# Patient Record
Sex: Female | Born: 1965 | Race: White | Hispanic: Yes | Marital: Married | Smoking: Never smoker
Health system: Southern US, Community
[De-identification: ages and names within clinical notes are randomized; demographics above are authoritative.]

## PROBLEM LIST (undated history)

## (undated) DIAGNOSIS — B019 Varicella without complication: Secondary | ICD-10-CM

## (undated) DIAGNOSIS — I10 Essential (primary) hypertension: Secondary | ICD-10-CM

## (undated) HISTORY — DX: Essential (primary) hypertension: I10

## (undated) HISTORY — PX: TUBAL LIGATION: SHX77

## (undated) HISTORY — PX: DILATION AND CURETTAGE OF UTERUS: SHX78

---

## 2000-10-25 ENCOUNTER — Other Ambulatory Visit: Admission: RE | Admit: 2000-10-25 | Discharge: 2000-10-25 | Payer: Self-pay | Admitting: Obstetrics and Gynecology

## 2002-05-17 ENCOUNTER — Other Ambulatory Visit: Admission: RE | Admit: 2002-05-17 | Discharge: 2002-05-17 | Payer: Self-pay | Admitting: Obstetrics and Gynecology

## 2006-10-31 ENCOUNTER — Emergency Department (HOSPITAL_COMMUNITY): Admission: EM | Admit: 2006-10-31 | Discharge: 2006-10-31 | Payer: Self-pay | Admitting: Emergency Medicine

## 2020-11-16 DIAGNOSIS — K56609 Unspecified intestinal obstruction, unspecified as to partial versus complete obstruction: Secondary | ICD-10-CM

## 2020-11-16 HISTORY — DX: Unspecified intestinal obstruction, unspecified as to partial versus complete obstruction: K56.609

## 2020-11-20 ENCOUNTER — Other Ambulatory Visit: Payer: Self-pay

## 2020-11-20 ENCOUNTER — Encounter (HOSPITAL_BASED_OUTPATIENT_CLINIC_OR_DEPARTMENT_OTHER): Payer: Self-pay | Admitting: *Deleted

## 2020-11-20 DIAGNOSIS — K5652 Intestinal adhesions [bands] with complete obstruction: Principal | ICD-10-CM | POA: Diagnosis present

## 2020-11-20 DIAGNOSIS — Z98891 History of uterine scar from previous surgery: Secondary | ICD-10-CM

## 2020-11-20 DIAGNOSIS — U071 COVID-19: Secondary | ICD-10-CM | POA: Diagnosis present

## 2020-11-20 DIAGNOSIS — K56609 Unspecified intestinal obstruction, unspecified as to partial versus complete obstruction: Secondary | ICD-10-CM | POA: Diagnosis not present

## 2020-11-20 LAB — URINALYSIS, ROUTINE W REFLEX MICROSCOPIC
Bilirubin Urine: NEGATIVE
Glucose, UA: NEGATIVE mg/dL
Hgb urine dipstick: NEGATIVE
Ketones, ur: NEGATIVE mg/dL
Leukocytes,Ua: NEGATIVE
Nitrite: NEGATIVE
Protein, ur: NEGATIVE mg/dL
Specific Gravity, Urine: 1.015 (ref 1.005–1.030)
pH: 7 (ref 5.0–8.0)

## 2020-11-20 LAB — CBC
HCT: 39 % (ref 36.0–46.0)
Hemoglobin: 13.3 g/dL (ref 12.0–15.0)
MCH: 31.9 pg (ref 26.0–34.0)
MCHC: 34.1 g/dL (ref 30.0–36.0)
MCV: 93.5 fL (ref 80.0–100.0)
Platelets: 226 10*3/uL (ref 150–400)
RBC: 4.17 MIL/uL (ref 3.87–5.11)
RDW: 13.2 % (ref 11.5–15.5)
WBC: 6.5 10*3/uL (ref 4.0–10.5)
nRBC: 0 % (ref 0.0–0.2)

## 2020-11-20 LAB — PREGNANCY, URINE: Preg Test, Ur: NEGATIVE

## 2020-11-20 LAB — COMPREHENSIVE METABOLIC PANEL
ALT: 30 U/L (ref 0–44)
AST: 30 U/L (ref 15–41)
Albumin: 4.6 g/dL (ref 3.5–5.0)
Alkaline Phosphatase: 61 U/L (ref 38–126)
Anion gap: 10 (ref 5–15)
BUN: 18 mg/dL (ref 6–20)
CO2: 24 mmol/L (ref 22–32)
Calcium: 9.4 mg/dL (ref 8.9–10.3)
Chloride: 103 mmol/L (ref 98–111)
Creatinine, Ser: 0.72 mg/dL (ref 0.44–1.00)
GFR, Estimated: >60 mL/min (ref >60)
Glucose, Bld: 112 mg/dL — ABNORMAL HIGH (ref 70–99)
Potassium: 4 mmol/L (ref 3.5–5.1)
Sodium: 137 mmol/L (ref 135–145)
Total Bilirubin: 0.7 mg/dL (ref 0.3–1.2)
Total Protein: 7.4 g/dL (ref 6.5–8.1)

## 2020-11-20 LAB — LIPASE, BLOOD: Lipase: 30 U/L (ref 11–51)

## 2020-11-20 NOTE — ED Triage Notes (Signed)
Mid abdominal pain radiating into back x 4 hours. Vomiting x 1, denies diarrhea. Pt in obvious pain in triage.

## 2020-11-21 ENCOUNTER — Emergency Department (HOSPITAL_BASED_OUTPATIENT_CLINIC_OR_DEPARTMENT_OTHER): Payer: 59

## 2020-11-21 ENCOUNTER — Inpatient Hospital Stay (HOSPITAL_COMMUNITY): Payer: 59

## 2020-11-21 ENCOUNTER — Inpatient Hospital Stay (HOSPITAL_BASED_OUTPATIENT_CLINIC_OR_DEPARTMENT_OTHER)
Admission: EM | Admit: 2020-11-21 | Discharge: 2020-11-24 | DRG: 388 | Disposition: A | Discharge status: Home / Self Care | Payer: 59 | Attending: Surgery | Admitting: Surgery

## 2020-11-21 ENCOUNTER — Encounter (HOSPITAL_COMMUNITY): Payer: Self-pay | Admitting: Surgery

## 2020-11-21 DIAGNOSIS — R109 Unspecified abdominal pain: Secondary | ICD-10-CM

## 2020-11-21 DIAGNOSIS — K5652 Intestinal adhesions [bands] with complete obstruction: Secondary | ICD-10-CM | POA: Diagnosis present

## 2020-11-21 DIAGNOSIS — Z0189 Encounter for other specified special examinations: Secondary | ICD-10-CM

## 2020-11-21 DIAGNOSIS — K56609 Unspecified intestinal obstruction, unspecified as to partial versus complete obstruction: Secondary | ICD-10-CM | POA: Diagnosis present

## 2020-11-21 DIAGNOSIS — U071 COVID-19: Secondary | ICD-10-CM

## 2020-11-21 DIAGNOSIS — Z98891 History of uterine scar from previous surgery: Secondary | ICD-10-CM | POA: Diagnosis not present

## 2020-11-21 LAB — PHOSPHORUS: Phosphorus: 3.6 mg/dL (ref 2.5–4.6)

## 2020-11-21 LAB — HEMOGLOBIN A1C
Hgb A1c MFr Bld: 5.1 % (ref 4.8–5.6)
Mean Plasma Glucose: 99.67 mg/dL

## 2020-11-21 LAB — SARS CORONAVIRUS 2 (TAT 6-24 HRS): SARS Coronavirus 2: POSITIVE — AB

## 2020-11-21 LAB — HIV ANTIBODY (ROUTINE TESTING W REFLEX): HIV Screen 4th Generation wRfx: NONREACTIVE

## 2020-11-21 LAB — PREALBUMIN: Prealbumin: 27.5 mg/dL (ref 18–38)

## 2020-11-21 LAB — MAGNESIUM: Magnesium: 1.9 mg/dL (ref 1.7–2.4)

## 2020-11-21 MED ORDER — LACTATED RINGERS IV BOLUS
1000.0000 mL | Freq: Once | INTRAVENOUS | Status: AC
  Administered 2020-11-21: 1000 mL via INTRAVENOUS

## 2020-11-21 MED ORDER — BISACODYL 10 MG RE SUPP: 10.0000 mg | Freq: Two times a day (BID) | RECTAL | Status: DC | PRN

## 2020-11-21 MED ORDER — PHENOL 1.4 % MT LIQD
1.0000 | OROMUCOSAL | Status: DC | PRN
  Filled 2020-11-21: qty 177

## 2020-11-21 MED ORDER — ENALAPRILAT 1.25 MG/ML IV SOLN
0.6250 mg | Freq: Four times a day (QID) | INTRAVENOUS | Status: DC | PRN
  Filled 2020-11-21: qty 1

## 2020-11-21 MED ORDER — ENOXAPARIN SODIUM 40 MG/0.4ML ~~LOC~~ SOLN
40.0000 mg | SUBCUTANEOUS | Status: DC
  Administered 2020-11-21 – 2020-11-24 (×4): 40 mg via SUBCUTANEOUS
  Filled 2020-11-21 (×4): qty 0.4

## 2020-11-21 MED ORDER — PHENOL 1.4 % MT LIQD
2.0000 | OROMUCOSAL | Status: DC | PRN
  Filled 2020-11-21: qty 177

## 2020-11-21 MED ORDER — KETOROLAC TROMETHAMINE 15 MG/ML IJ SOLN
15.0000 mg | Freq: Four times a day (QID) | INTRAMUSCULAR | Status: DC | PRN
  Administered 2020-11-22: 07:00:00 15 mg via INTRAVENOUS
  Filled 2020-11-21: qty 1

## 2020-11-21 MED ORDER — SODIUM CHLORIDE 0.9 % IV SOLN: Freq: Three times a day (TID) | INTRAVENOUS | Status: DC | PRN

## 2020-11-21 MED ORDER — LACTATED RINGERS IV BOLUS: 1000.0000 mL | Freq: Three times a day (TID) | INTRAVENOUS | Status: DC | PRN

## 2020-11-21 MED ORDER — SODIUM CHLORIDE 0.9 % IV SOLN
8.0000 mg | Freq: Four times a day (QID) | INTRAVENOUS | Status: DC | PRN
  Filled 2020-11-21: qty 4

## 2020-11-21 MED ORDER — ONDANSETRON HCL 4 MG/2ML IJ SOLN
4.0000 mg | Freq: Four times a day (QID) | INTRAMUSCULAR | Status: DC | PRN
  Administered 2020-11-21: 4 mg via INTRAVENOUS
  Filled 2020-11-21: qty 2

## 2020-11-21 MED ORDER — HYDROCORTISONE (PERIANAL) 2.5 % EX CREA
1.0000 "application " | TOPICAL_CREAM | Freq: Four times a day (QID) | CUTANEOUS | Status: DC | PRN
  Filled 2020-11-21: qty 28.35

## 2020-11-21 MED ORDER — HYDROCORTISONE 1 % EX CREA
1.0000 "application " | TOPICAL_CREAM | Freq: Three times a day (TID) | CUTANEOUS | Status: DC | PRN
  Filled 2020-11-21: qty 28

## 2020-11-21 MED ORDER — DIPHENHYDRAMINE HCL 50 MG/ML IJ SOLN: 12.5000 mg | Freq: Four times a day (QID) | INTRAMUSCULAR | Status: DC | PRN

## 2020-11-21 MED ORDER — DIATRIZOATE MEGLUMINE & SODIUM 66-10 % PO SOLN
90.0000 mL | Freq: Once | ORAL | Status: DC
  Filled 2020-11-21: qty 90

## 2020-11-21 MED ORDER — HYDROMORPHONE HCL 1 MG/ML IJ SOLN
1.0000 mg | Freq: Once | INTRAMUSCULAR | Status: AC
Start: 2020-11-21 — End: 2020-11-21
  Administered 2020-11-21: 1 mg via INTRAVENOUS
  Filled 2020-11-21: qty 1

## 2020-11-21 MED ORDER — IOHEXOL 300 MG/ML  SOLN
100.0000 mL | Freq: Once | INTRAMUSCULAR | Status: AC | PRN
  Administered 2020-11-21: 80 mL via INTRAVENOUS

## 2020-11-21 MED ORDER — METOPROLOL TARTRATE 5 MG/5ML IV SOLN: 5.0000 mg | Freq: Four times a day (QID) | INTRAVENOUS | Status: DC | PRN

## 2020-11-21 MED ORDER — BISACODYL 10 MG RE SUPP
10.0000 mg | Freq: Every day | RECTAL | Status: DC
  Administered 2020-11-21 – 2020-11-22 (×2): 10 mg via RECTAL
  Filled 2020-11-21 (×2): qty 1

## 2020-11-21 MED ORDER — MENTHOL 3 MG MT LOZG
1.0000 | LOZENGE | OROMUCOSAL | Status: DC | PRN
  Filled 2020-11-21: qty 9

## 2020-11-21 MED ORDER — PROCHLORPERAZINE EDISYLATE 10 MG/2ML IJ SOLN
5.0000 mg | INTRAMUSCULAR | Status: DC | PRN
  Filled 2020-11-21: qty 2

## 2020-11-21 MED ORDER — ALUM & MAG HYDROXIDE-SIMETH 200-200-20 MG/5ML PO SUSP: 30.0000 mL | Freq: Four times a day (QID) | ORAL | Status: DC | PRN

## 2020-11-21 MED ORDER — LACTATED RINGERS IV SOLN: INTRAVENOUS | Status: DC

## 2020-11-21 MED ORDER — ONDANSETRON HCL 4 MG/2ML IJ SOLN
4.0000 mg | Freq: Once | INTRAMUSCULAR | Status: AC
  Administered 2020-11-21: 4 mg via INTRAVENOUS
  Filled 2020-11-21: qty 2

## 2020-11-21 MED ORDER — SODIUM CHLORIDE 0.9 % IV BOLUS
1000.0000 mL | Freq: Once | INTRAVENOUS | Status: AC
  Administered 2020-11-21: 1000 mL via INTRAVENOUS

## 2020-11-21 MED ORDER — DIATRIZOATE MEGLUMINE & SODIUM 66-10 % PO SOLN
90.0000 mL | Freq: Once | ORAL | Status: AC
  Administered 2020-11-21: 90 mL via NASOGASTRIC
  Filled 2020-11-21 (×2): qty 90

## 2020-11-21 MED ORDER — METHOCARBAMOL 1000 MG/10ML IJ SOLN
1000.0000 mg | Freq: Four times a day (QID) | INTRAVENOUS | Status: DC | PRN
  Filled 2020-11-21: qty 10

## 2020-11-21 MED ORDER — MAGIC MOUTHWASH
15.0000 mL | Freq: Four times a day (QID) | ORAL | Status: DC | PRN
  Filled 2020-11-21: qty 15

## 2020-11-21 MED ORDER — HYDROMORPHONE HCL 1 MG/ML IJ SOLN
0.5000 mg | INTRAMUSCULAR | Status: DC | PRN
  Administered 2020-11-21: 2 mg via INTRAVENOUS
  Administered 2020-11-21 (×2): 1 mg via INTRAVENOUS
  Filled 2020-11-21 (×2): qty 2
  Filled 2020-11-21: qty 1

## 2020-11-21 MED ORDER — LIP MEDEX EX OINT
1.0000 "application " | TOPICAL_OINTMENT | Freq: Two times a day (BID) | CUTANEOUS | Status: DC
  Administered 2020-11-21 – 2020-11-22 (×2): 1 via TOPICAL
  Filled 2020-11-21 (×2): qty 7

## 2020-11-21 MED ORDER — SIMETHICONE 40 MG/0.6ML PO SUSP
40.0000 mg | Freq: Four times a day (QID) | ORAL | Status: DC | PRN
  Filled 2020-11-21: qty 0.6

## 2020-11-21 MED ORDER — ACETAMINOPHEN 650 MG RE SUPP: 650.0000 mg | Freq: Four times a day (QID) | RECTAL | Status: DC | PRN

## 2020-11-21 MED ORDER — GUAIFENESIN-DM 100-10 MG/5ML PO SYRP: 10.0000 mL | ORAL_SOLUTION | ORAL | Status: DC | PRN

## 2020-11-21 NOTE — H&P (Signed)
Cheryl Hanna  1966-11-03 [id number]  CARE TEAM:  PCP: Patient, No Pcp Per  Outpatient Care Team: Patient Care Team: Patient, No Pcp Per as PCP - General (General Practice)  Inpatient Treatment Team: Treatment Team: Attending Provider: Fatima Blank, MD; Consulting Physician: Edison Pace Md, MD; Registered Nurse: Dolphus Jenny, RN   This patient is a 55 y.o.female who presents today for surgical evaluation at the request of Dr Dayna Barker.   Chief complaint / Reason for evaluation: SBO  Active woman who is had occasional intermittent cramping for many years but nothing severe.  However had more severe attack with nausea vomiting and retching.  Became unbearable.  Came to [facility] emergency room.  Exam and CT scan findings concerning for bowel obstruction.  Surgical consultation requested.  Patient transferred from [facility] emergency room to [facility] for this.  NG tube placed.  Some relief but still having some crampiness.  No history of GI bleeding.  No bloody hematemesis or coffee-ground emesis she is originally from [place].  She had a C-section in 1993 but no other surgeries.  Does not smoke.  No diabetes.  No cardiac or pulmonary issues.  Normally can walk several miles without difficulty.  Usually moves her bowels once or twice a day.  No personal nor family history of GI/colon cancer, inflammatory bowel disease, irritable bowel syndrome, allergy such as Celiac Sprue, dietary/dairy problems, colitis, ulcers nor gastritis.  No recent sick contacts/gastroenteritis.  No travel outside the country.  No changes in diet.  No dysphagia to solids or liquids.  No significant heartburn or reflux.  No hematochezia, hematemesis, coffee ground emesis.  No evidence of prior gastric/peptic ulceration.    Assessment  Cheryl Hanna  55 y.o. female       Problem List:  Principal Problem:   SBO (small bowel obstruction) (HCC) Active Problems:   Abdominal cramping   Small bowel  obstruction and distal ileum transition point.  Most likely due to adhesions from C-section  Plan:  IV fluids.  Nausea and pain control.  Nasogastric tube decompression.   Hospitalization.  Small bowel protocol.  Pathophysiology of small bowel obstruction with differential diagnosis explained.  Plan of rehydration, nausea and pain control and nasogastric tube decompression with serial exams and x-rays explained.  Patient expressed understanding.  She is uncomfortable but she does not have frank peritonitis.  There is no evidence of perforation.  She does not have free air.  Therefore try and control this nonoperatively with close follow-up.  The fact that it is a high-grade obstruction gives me pause that she may not fly and will require surgery in the next couple days, but we will see.  -VTE prophylaxis- SCDs, etc  -mobilize as tolerated to help recovery  30 minutes spent in review, evaluation, examination, counseling, and coordination of care.  More than 50% of that time was spent in counseling.  Adin Hector, MD, FACS, MASCRS Gastrointestinal and Minimally Invasive Surgery  [facility] [address] [place], [place] [address] [phone] Fax [phone] Main/Paging  CONTACT INFORMATION: Weekday (9AM-5PM) concerns: Call [facility] main office at [phone] Weeknight (5PM-9AM) or Weekend/Holiday concerns: Check [email or web address] for General Surgery [facility] coverage (Please, do not use SecureChat as it is not reliable communication to operating surgeons for immediate patient care)      11/21/2020      History reviewed. No pertinent past medical history.  Past Surgical History:  Procedure Laterality Date  .  CESAREAN SECTION      Social History   Socioeconomic History  . Marital status: Married    Spouse name: Not on file  . Number of children: Not on file  . Years of education: Not on file  . Highest education level: Not on file   Occupational History  . Not on file  Tobacco Use  . Smoking status: Never Smoker  . Smokeless tobacco: Never Used  Substance and Sexual Activity  . Alcohol use: Not Currently  . Drug use: Not Currently  . Sexual activity: Not on file  Other Topics Concern  . Not on file  Social History Narrative  . Not on file   Social Determinants of Health   Financial Resource Strain: Not on file  Food Insecurity: Not on file  Transportation Needs: Not on file  Physical Activity: Not on file  Stress: Not on file  Social Connections: Not on file  Intimate Partner Violence: Not on file    History reviewed. No pertinent family history.  Current Facility-Administered Medications  Medication Dose Route Frequency Provider Last Rate Last Admin  . acetaminophen (TYLENOL) suppository 650 mg  650 mg Rectal Q6H PRN Karie Soda, MD      . alum & mag hydroxide-simeth (MAALOX/MYLANTA) 200-200-20 MG/5ML suspension 30 mL  30 mL Oral Q6H PRN Karie Soda, MD      . bisacodyl (DULCOLAX) suppository 10 mg  10 mg Rectal Daily Karie Soda, MD      . diatrizoate meglumine-sodium (GASTROGRAFIN) 66-10 % solution 90 mL  90 mL Per NG tube Once Karie Soda, MD      . diphenhydrAMINE (BENADRYL) injection 12.5-25 mg  12.5-25 mg Intravenous Q6H PRN Karie Soda, MD      . HYDROmorphone (DILAUDID) injection 0.5-2 mg  0.5-2 mg Intravenous Q2H PRN Karie Soda, MD   2 mg at 11/21/20 0352  . lactated ringers bolus 1,000 mL  1,000 mL Intravenous Once Karie Soda, MD      . lactated ringers bolus 1,000 mL  1,000 mL Intravenous Q8H PRN Tayten Bergdoll, Viviann Spare, MD      . lip balm (CARMEX) ointment 1 application  1 application Topical BID Karie Soda, MD      . magic mouthwash  15 mL Oral QID PRN Karie Soda, MD      . menthol-cetylpyridinium (CEPACOL) lozenge 3 mg  1 lozenge Oral PRN Karie Soda, MD      . methocarbamol (ROBAXIN) 1,000 mg in dextrose 5 % 100 mL IVPB  1,000 mg Intravenous Q6H PRN Karie Soda, MD       . ondansetron [facility]) injection 4 mg  4 mg Intravenous Q6H PRN Karie Soda, MD   4 mg at 11/21/20 0351   Or  . ondansetron (ZOFRAN) 8 mg in sodium chloride 0.9 % 50 mL IVPB  8 mg Intravenous Q6H PRN Karie Soda, MD      . phenol (CHLORASEPTIC) mouth spray 2 spray  2 spray Mouth/Throat PRN Karie Soda, MD      . prochlorperazine (COMPAZINE) injection 5-10 mg  5-10 mg Intravenous Q4H PRN Karie Soda, MD       No current outpatient medications on file.     No Known Allergies  ROS:   All other systems reviewed & are negative except per HPI or as noted below: Constitutional:  No fevers, chills, sweats.  Weight stable Eyes:  No vision changes, No discharge HENT:  No sore throats, nasal drainage Lymph: No neck swelling, No bruising easily Pulmonary:  No cough, productive sputum CV: No orthopnea, PND  Patient walks 60 minutes for about 2 miles without difficulty.  No exertional chest/neck/shoulder/arm pain. GI:  No personal nor family history of GI/colon cancer, inflammatory bowel disease, irritable bowel syndrome, allergy such as Celiac Sprue, dietary/dairy problems, colitis, ulcers nor gastritis.  No recent sick contacts/gastroenteritis.  No travel outside the country.  No changes in diet. Renal: No UTIs, No hematuria Genital:  No drainage, bleeding, masses Musculoskeletal: No severe joint pain.  Good ROM major joints Skin:  No sores or lesions.  No rashes Heme/Lymph:  No easy bleeding.  No swollen lymph nodes Neuro: No focal weakness/numbness.  No seizures Psych: No suicidal ideation.  No hallucinations  BP 139/88   Pulse 71   Temp 98 F (36.7 C)   Resp 16   LMP 09/20/2020   SpO2 100%   Physical Exam: Constitutional: Not cachectic.  Hygeine adequate.  Vitals signs as above.   Eyes: Pupils reactive, normal extraocular movements. Sclera nonicteric Neuro: CN II-XII intact.  No major focal sensory defects.  No major motor deficits. Lymph: No head/neck/groin  lymphadenopathy Psych:  No severe agitation.  No severe anxiety.  Judgment & insight Adequate, Oriented x4, HENT: Normocephalic, Mucus membranes moist.  No thrush.   Neck: Supple, No tracheal deviation.  No obvious thyromegaly Chest: No pain to chest wall compression.  Good respiratory excursion.  No audible wheezing CV:  Pulses intact.  Regular rhythm.  No major extremity edema Abdomen:  Somewhat firm.  Moderately distended.  Mild diffuse abdominal discomfort but no frank point of guardedness. No incarcerated hernias.  No hepatomegaly.  No splenomegaly Gen:  No inguinal hernias.  No inguinal lymphadenopathy.   Ext: No obvious deformity or contracture no significant edema.  No cyanosis Skin: No major subcutaneous nodules.  Warm and dry Musculoskeletal: Severe joint rigidity not present.  No obvious clubbing.  No digital petechiae.     Results:   Labs: Results for orders placed or performed during the hospital encounter of 11/21/20 (from the past 48 hour(s))  Lipase, blood     Status: None   Collection Time: 11/20/20 11:23 PM  Result Value Ref Range   Lipase 30 11 - 51 U/L    Comment: Performed at [facility], [address] [place], [place] [address]  Comprehensive metabolic panel     Status: Abnormal   Collection Time: 11/20/20 11:23 PM  Result Value Ref Range   Sodium 137 135 - 145 mmol/L   Potassium 4.0 3.5 - 5.1 mmol/L   Chloride 103 98 - 111 mmol/L   CO2 24 22 - 32 mmol/L   Glucose, Bld 112 (H) 70 - 99 mg/dL    Comment: Glucose reference range applies only to samples taken after fasting for at least 8 hours.   BUN 18 6 - 20 mg/dL   Creatinine, Ser 2.63 0.44 - 1.00 mg/dL   Calcium 9.4 8.9 - 33.5 mg/dL   Total Protein 7.4 6.5 - 8.1 g/dL   Albumin 4.6 3.5 - 5.0 g/dL   AST 30 15 - 41 U/L   ALT 30 0 - 44 U/L   Alkaline Phosphatase 61 38 - 126 U/L   Total Bilirubin 0.7 0.3 - 1.2 mg/dL   GFR, Estimated >45 >62 mL/min    Comment: (NOTE) Calculated using the CKD-EPI  Creatinine Equation (2021)    Anion gap 10 5 - 15    Comment: Performed at [facility], [address] [place], [place] [address]  CBC     Status: None   Collection Time: 11/20/20 11:23 PM  Result Value Ref Range   WBC 6.5 4.0 - 10.5 K/uL   RBC 4.17 3.87 - 5.11 MIL/uL   Hemoglobin 13.3 12.0 - 15.0 g/dL   HCT 62.1 30.8 - 65.7 %   MCV 93.5 80.0 - 100.0 fL   MCH 31.9 26.0 - 34.0 pg   MCHC 34.1 30.0 - 36.0 g/dL   RDW 84.6 96.2 - 95.2 %   Platelets 226 150 - 400 K/uL   nRBC 0.0 0.0 - 0.2 %    Comment: Performed at [facility], [address] [place], [place] [address]  Urinalysis, Routine w reflex microscopic     Status: None   Collection Time: 11/20/20 11:23 PM  Result Value Ref Range   Color, Urine YELLOW YELLOW   APPearance CLEAR CLEAR   Specific Gravity, Urine 1.015 1.005 - 1.030   pH 7.0 5.0 - 8.0   Glucose, UA NEGATIVE NEGATIVE mg/dL   Hgb urine dipstick NEGATIVE NEGATIVE   Bilirubin Urine NEGATIVE NEGATIVE   Ketones, ur NEGATIVE NEGATIVE mg/dL   Protein, ur NEGATIVE NEGATIVE mg/dL   Nitrite NEGATIVE NEGATIVE   Leukocytes,Ua NEGATIVE NEGATIVE    Comment: Microscopic not done on urines with negative protein, blood, leukocytes, nitrite, or glucose < 500 mg/dL. Performed at [facility], [address] [place], [place] [address]   Pregnancy, urine     Status: None   Collection Time: 11/20/20 11:23 PM  Result Value Ref Range   Preg Test, Ur NEGATIVE NEGATIVE    Comment:        THE SENSITIVITY OF THIS METHODOLOGY IS >20 mIU/mL. Performed at [facility], [address] [place], [place] [address]   Phosphorus     Status: None   Collection Time: 11/20/20 11:23 PM  Result Value Ref Range   Phosphorus 3.6 2.5 - 4.6 mg/dL    Comment: Performed at [facility], [address] [place], [place] [address]  Magnesium     Status: None   Collection Time: 11/20/20 11:23 PM  Result Value Ref Range   Magnesium 1.9 1.7 -  2.4 mg/dL    Comment: Performed at [facility], [address] [place], [place] [address]    Imaging / Studies: CT ABDOMEN PELVIS W CONTRAST  Result Date: 11/21/2020 CLINICAL DATA:  Mid abdominal pain, vomiting EXAM: CT ABDOMEN AND PELVIS WITH CONTRAST TECHNIQUE: Multidetector CT imaging of the abdomen and pelvis was performed using the standard protocol following bolus administration of intravenous contrast. CONTRAST:  49mL OMNIPAQUE IOHEXOL 300 MG/ML  SOLN COMPARISON:  None. FINDINGS: Lower chest: The visualized lung bases are clear bilaterally. The visualized heart and pericardium are unremarkable. Hepatobiliary: No focal liver abnormality is seen. No gallstones, gallbladder wall thickening, or biliary dilatation. Pancreas: Unremarkable. No pancreatic ductal dilatation or surrounding inflammatory changes. Spleen: Unremarkable Adrenals/Urinary Tract: Adrenal glands are unremarkable. Kidneys are normal, without renal calculi, focal lesion, or hydronephrosis. Bladder is unremarkable. Stomach/Bowel: A distal small bowel obstruction is present with a single point of transition noted just proximal to the ileocecal junction, best seen on axial image # 45, coronal image # 32, and sagittal image # 38. A focal mass is not clearly identified and an underlying adhesion is favored as an etiology. The proximal bowel is dilated, fluid-filled, and demonstrates fecalization of intraluminal contents. There is  normal bowel enhancement. No free intraperitoneal gas. Trace free fluid within the pelvis. The appendix is unremarkable. The colon is unremarkable. Vascular/Lymphatic: No significant vascular findings are present. No enlarged abdominal or pelvic lymph nodes. Reproductive: Uterus and bilateral adnexa are unremarkable. Other: No pathologic adenopathy within the abdomen and pelvis. Musculoskeletal: No acute or significant osseous findings. IMPRESSION: High-grade distal small-bowel obstruction just proximal to  the ileocecal junction, likely related to an underlying adhesion. Trace free fluid within the pelvis. No evidence of perforation. Electronically Signed   By: Helyn Numbers MD   On: 11/21/2020 01:21   DG Abd Portable 1V-Small Bowel Protocol-Position Verification  Result Date: 11/21/2020 CLINICAL DATA:  Initial evaluation for NG tube placement. EXAM: PORTABLE ABDOMEN - 1 VIEW COMPARISON:  None. FINDINGS: Enteric tube in place with tip overlying the gastric body, side hole beyond the GE junction. Mild gaseous distension of the stomach noted. Scattered dilated gas-filled loops of bowel noted within the upper abdomen, consistent with previously identified small bowel obstruction. Visualized lungs are clear. IMPRESSION: Tip of enteric tube overlying the gastric body, side hole beyond the GE junction. Electronically Signed   By: Rise Mu M.D.   On: 11/21/2020 01:56    Medications / Allergies: per chart  Antibiotics: Anti-infectives (From admission, onward)   None        Note: Portions of this report may have been transcribed using voice recognition software. Every effort was made to ensure accuracy; however, inadvertent computerized transcription errors may be present.   Any transcriptional errors that result from this process are unintentional.    Ardeth Sportsman, MD, FACS, MASCRS Gastrointestinal and Minimally Invasive Surgery  [facility] [address] [place], [place] [address] [phone] Fax [phone] Main/Paging  CONTACT INFORMATION: Weekday (9AM-5PM) concerns: Call [facility] main office at [phone] Weeknight (5PM-9AM) or Weekend/Holiday concerns: Check [email or web address] for General Surgery [facility] coverage (Please, do not use SecureChat as it is not reliable communication to operating surgeons for immediate patient care)      11/21/2020  4:40 AM

## 2020-11-21 NOTE — ED Notes (Signed)
Report given to 3E 

## 2020-11-21 NOTE — ED Notes (Signed)
Pt to ER via transfer from [facility].  Pt diagnosed with SBO.  NG tube in place connected to low, intermittent suction upon arrival.  Pt grimacing in pain at this time.  Will continue to monitor.

## 2020-11-21 NOTE — ED Notes (Signed)
Patient hooked back up to low suction, placed on this per sx.

## 2020-11-21 NOTE — ED Provider Notes (Signed)
[facility] EMERGENCY DEPARTMENT Provider Note   CSN: [id number] Arrival date & time: 11/20/20  2313     History Chief Complaint  Patient presents with  . Abdominal Pain    Cheryl Hanna is a 55 y.o. female.  Acute onset of right upper quadrant abdominal pain while eating tonight.  Very severe nature.  States has had episodes similar to this in the past but it resolved and she was evaluated the emergency room without any resolution.  Has a history of a cesarean section but no other surgeries.  She has not had a cholecystectomy or appendectomy.  She is not taking medications.  She has no recent changes in her diet.  No fevers.  She has vomited a couple times since this happened.  No diarrhea or constipation.        History reviewed. No pertinent past medical history.  Patient Active Problem List   Diagnosis Date Noted  . SBO (small bowel obstruction) (HCC) 11/21/2020    History reviewed. No pertinent surgical history.   OB History   No obstetric history on file.     History reviewed. No pertinent family history.  Social History   Tobacco Use  . Smoking status: Never Smoker  . Smokeless tobacco: Never Used  Substance Use Topics  . Alcohol use: Not Currently  . Drug use: Not Currently    Home Medications Prior to Admission medications   Not on File    Allergies    Patient has no known allergies.  Review of Systems   Review of Systems  All other systems reviewed and are negative.   Physical Exam Updated Vital Signs BP (!) 154/96 (BP Location: Right Arm)   Pulse 70   Temp 98 F (36.7 C)   Resp 12   LMP 09/20/2020   SpO2 99%   Physical Exam Vitals reviewed. Nursing note reviewed: Distress secondary to pain.  Constitutional:      Appearance: She is well-developed and well-nourished.  HENT:     Head: Normocephalic and atraumatic.     Nose: Nose normal. No congestion or rhinorrhea.     Mouth/Throat:     Mouth: Mucous membranes are moist.      Pharynx: Oropharynx is clear.  Eyes:     Pupils: Pupils are equal, round, and reactive to light.  Cardiovascular:     Rate and Rhythm: Regular rhythm. Tachycardia present.  Pulmonary:     Effort: No respiratory distress.     Breath sounds: No stridor.  Abdominal:     General: Abdomen is flat. There is no distension.  Musculoskeletal:        General: No swelling or tenderness. Normal range of motion.     Cervical back: Normal range of motion.  Skin:    General: Skin is warm and dry.  Neurological:     General: No focal deficit present.     Mental Status: She is alert.     ED Results / Procedures / Treatments   Labs (all labs ordered are listed, but only abnormal results are displayed) Labs Reviewed  COMPREHENSIVE METABOLIC PANEL - Abnormal; Notable for the following components:      Result Value   Glucose, Bld 112 (*)    All other components within normal limits  SARS CORONAVIRUS 2 (TAT 6-24 HRS)  LIPASE, BLOOD  CBC  URINALYSIS, ROUTINE W REFLEX MICROSCOPIC  PREGNANCY, URINE  PHOSPHORUS  MAGNESIUM  PREALBUMIN  HEMOGLOBIN A1C    EKG None  Radiology  CT ABDOMEN PELVIS W CONTRAST  Result Date: 11/21/2020 CLINICAL DATA:  Mid abdominal pain, vomiting EXAM: CT ABDOMEN AND PELVIS WITH CONTRAST TECHNIQUE: Multidetector CT imaging of the abdomen and pelvis was performed using the standard protocol following bolus administration of intravenous contrast. CONTRAST:  52mL OMNIPAQUE IOHEXOL 300 MG/ML  SOLN COMPARISON:  None. FINDINGS: Lower chest: The visualized lung bases are clear bilaterally. The visualized heart and pericardium are unremarkable. Hepatobiliary: No focal liver abnormality is seen. No gallstones, gallbladder wall thickening, or biliary dilatation. Pancreas: Unremarkable. No pancreatic ductal dilatation or surrounding inflammatory changes. Spleen: Unremarkable Adrenals/Urinary Tract: Adrenal glands are unremarkable. Kidneys are normal, without renal calculi, focal  lesion, or hydronephrosis. Bladder is unremarkable. Stomach/Bowel: A distal small bowel obstruction is present with a single point of transition noted just proximal to the ileocecal junction, best seen on axial image # 45, coronal image # 32, and sagittal image # 38. A focal mass is not clearly identified and an underlying adhesion is favored as an etiology. The proximal bowel is dilated, fluid-filled, and demonstrates fecalization of intraluminal contents. There is normal bowel enhancement. No free intraperitoneal gas. Trace free fluid within the pelvis. The appendix is unremarkable. The colon is unremarkable. Vascular/Lymphatic: No significant vascular findings are present. No enlarged abdominal or pelvic lymph nodes. Reproductive: Uterus and bilateral adnexa are unremarkable. Other: No pathologic adenopathy within the abdomen and pelvis. Musculoskeletal: No acute or significant osseous findings. IMPRESSION: High-grade distal small-bowel obstruction just proximal to the ileocecal junction, likely related to an underlying adhesion. Trace free fluid within the pelvis. No evidence of perforation. Electronically Signed   By: Helyn Numbers MD   On: 11/21/2020 01:21   DG Abd Portable 1V-Small Bowel Protocol-Position Verification  Result Date: 11/21/2020 CLINICAL DATA:  Initial evaluation for NG tube placement. EXAM: PORTABLE ABDOMEN - 1 VIEW COMPARISON:  None. FINDINGS: Enteric tube in place with tip overlying the gastric body, side hole beyond the GE junction. Mild gaseous distension of the stomach noted. Scattered dilated gas-filled loops of bowel noted within the upper abdomen, consistent with previously identified small bowel obstruction. Visualized lungs are clear. IMPRESSION: Tip of enteric tube overlying the gastric body, side hole beyond the GE junction. Electronically Signed   By: Rise Mu M.D.   On: 11/21/2020 01:56    Procedures Procedures (including critical care time)  Medications  Ordered in ED Medications  diatrizoate meglumine-sodium (GASTROGRAFIN) 66-10 % solution 90 mL (has no administration in time range)  diatrizoate meglumine-sodium (GASTROGRAFIN) 66-10 % solution 90 mL (has no administration in time range)  lactated ringers bolus 1,000 mL (has no administration in time range)  lactated ringers bolus 1,000 mL (has no administration in time range)  HYDROmorphone (DILAUDID) injection 0.5-2 mg (1 mg Intravenous Given 11/21/20 0159)  methocarbamol (ROBAXIN) 1,000 mg in dextrose 5 % 100 mL IVPB (has no administration in time range)  acetaminophen (TYLENOL) suppository 650 mg (has no administration in time range)  ondansetron (ZOFRAN) injection 4 mg (has no administration in time range)    Or  ondansetron (ZOFRAN) 8 mg in sodium chloride 0.9 % 50 mL IVPB (has no administration in time range)  prochlorperazine (COMPAZINE) injection 5-10 mg (has no administration in time range)  lip balm (CARMEX) ointment 1 application (has no administration in time range)  phenol (CHLORASEPTIC) mouth spray 2 spray (has no administration in time range)  menthol-cetylpyridinium (CEPACOL) lozenge 3 mg (has no administration in time range)  magic mouthwash (has no administration in time range)  alum &  mag hydroxide-simeth (MAALOX/MYLANTA) 200-200-20 MG/5ML suspension 30 mL (has no administration in time range)  bisacodyl (DULCOLAX) suppository 10 mg (has no administration in time range)  diphenhydrAMINE (BENADRYL) injection 12.5-25 mg (has no administration in time range)  HYDROmorphone (DILAUDID) injection 1 mg (1 mg Intravenous Given 11/21/20 0028)  ondansetron (ZOFRAN) injection 4 mg (4 mg Intravenous Given 11/21/20 0027)  sodium chloride 0.9 % bolus 1,000 mL (1,000 mLs Intravenous New Bag/Given 11/21/20 0027)  iohexol (OMNIPAQUE) 300 MG/ML solution 100 mL (80 mLs Intravenous Contrast Given 11/21/20 0049)    ED Course  I have reviewed the triage vital signs and the nursing notes.  Pertinent  labs & imaging results that were available during my care of the patient were reviewed by me and considered in my medical decision making (see chart for details).    MDM Rules/Calculators/A&P                          Patient found to have a small bowel obstruction, high-grade, discussed with Dr. Johney Maine with general surgery at [facility] who accepts for evaluation in the emergency room.  Discussed with Dr. Leonette Monarch who is aware of the patient as well.  Nasogastric tube placed here for comfort.  Tolerated well.  Final Clinical Impression(s) / ED Diagnoses Final diagnoses:  Encounter for imaging study to confirm nasogastric (NG) tube placement  Small bowel obstruction [facility])    Rx / DC Orders ED Discharge Orders    None       Rochele Lueck, Corene Cornea, MD 11/21/20 [phone]

## 2020-11-21 NOTE — ED Notes (Signed)
Patient placed on oxygen due to decrease in oxygen saturation to 70% due to post pain meds. Repositioned patient and placed patient on 2L [place]. With oxygen saturations increasing to 97%. Will continue to monitor. Patient tolerating well.

## 2020-11-22 ENCOUNTER — Inpatient Hospital Stay (HOSPITAL_COMMUNITY): Payer: 59

## 2020-11-22 LAB — CBC
HCT: 38.6 % (ref 36.0–46.0)
Hemoglobin: 13.1 g/dL (ref 12.0–15.0)
MCH: 32.5 pg (ref 26.0–34.0)
MCHC: 33.9 g/dL (ref 30.0–36.0)
MCV: 95.8 fL (ref 80.0–100.0)
Platelets: 200 10*3/uL (ref 150–400)
RBC: 4.03 MIL/uL (ref 3.87–5.11)
RDW: 13.2 % (ref 11.5–15.5)
WBC: 6.7 10*3/uL (ref 4.0–10.5)
nRBC: 0 % (ref 0.0–0.2)

## 2020-11-22 LAB — BASIC METABOLIC PANEL
Anion gap: 8 (ref 5–15)
BUN: 11 mg/dL (ref 6–20)
CO2: 26 mmol/L (ref 22–32)
Calcium: 8.7 mg/dL — ABNORMAL LOW (ref 8.9–10.3)
Chloride: 104 mmol/L (ref 98–111)
Creatinine, Ser: 0.59 mg/dL (ref 0.44–1.00)
GFR, Estimated: >60 mL/min (ref >60)
Glucose, Bld: 103 mg/dL — ABNORMAL HIGH (ref 70–99)
Potassium: 3.8 mmol/L (ref 3.5–5.1)
Sodium: 138 mmol/L (ref 135–145)

## 2020-11-22 LAB — MAGNESIUM: Magnesium: 1.9 mg/dL (ref 1.7–2.4)

## 2020-11-22 NOTE — Progress Notes (Cosign Needed Addendum)
Progress Note     Subjective: Patient reports she still has some soreness in RLQ but pain improving from yesterday. Denies nausea. Had a BM yesterday.   Moved to isolation secondary to positive COVID screening. No symptoms from this. Patient had booster vaccination in December.   Objective: Vital signs in last 24 hours: Temp:  [98.2 F (36.8 C)-99.5 F (37.5 C)] 98.2 F (36.8 C) (01/07 0610) Pulse Rate:  [66-88] 66 (01/07 0610) Resp:  [16-19] 17 (01/07 0610) BP: (126-157)/(79-95) 157/95 (01/07 0610) SpO2:  [95 %-99 %] 99 % (01/07 0610) Last BM Date: 11/21/20  Intake/Output from previous day: 01/06 0701 - 01/07 0700 In: 2457.7 [P.O.:60; I.V.:1398.7; IV Piggyback:999] Out: 750 [Urine:650; Emesis/NG output:100] Intake/Output this shift: Total I/O In: 245.7 [I.V.:245.7] Out: -   PE: General: pleasant, WD, thin female who is laying in bed in NAD Lungs:  Respiratory effort nonlabored Abd: soft, mildly ttp in RLQ, no peritonitis, ND   Lab Results:  Recent Labs    11/20/20 2323 11/22/20 0539  WBC 6.5 6.7  HGB 13.3 13.1  HCT 39.0 38.6  PLT 226 200   BMET Recent Labs    11/20/20 2323 11/22/20 0539  NA 137 138  K 4.0 3.8  CL 103 104  CO2 24 26  GLUCOSE 112* 103*  BUN 18 11  CREATININE 0.72 0.59  CALCIUM 9.4 8.7*   PT/INR No results for input(s): LABPROT, INR in the last 72 hours. CMP     Component Value Date/Time   NA 138 11/22/2020 0539   K 3.8 11/22/2020 0539   CL 104 11/22/2020 0539   CO2 26 11/22/2020 0539   GLUCOSE 103 (H) 11/22/2020 0539   BUN 11 11/22/2020 0539   CREATININE 0.59 11/22/2020 0539   CALCIUM 8.7 (L) 11/22/2020 0539   PROT 7.4 11/20/2020 2323   ALBUMIN 4.6 11/20/2020 2323   AST 30 11/20/2020 2323   ALT 30 11/20/2020 2323   ALKPHOS 61 11/20/2020 2323   BILITOT 0.7 11/20/2020 2323   GFRNONAA >60 11/22/2020 0539   Lipase     Component Value Date/Time   LIPASE 30 11/20/2020 2323       Studies/Results: CT ABDOMEN PELVIS  W CONTRAST  Result Date: 11/21/2020 CLINICAL DATA:  Mid abdominal pain, vomiting EXAM: CT ABDOMEN AND PELVIS WITH CONTRAST TECHNIQUE: Multidetector CT imaging of the abdomen and pelvis was performed using the standard protocol following bolus administration of intravenous contrast. CONTRAST:  44mL OMNIPAQUE IOHEXOL 300 MG/ML  SOLN COMPARISON:  None. FINDINGS: Lower chest: The visualized lung bases are clear bilaterally. The visualized heart and pericardium are unremarkable. Hepatobiliary: No focal liver abnormality is seen. No gallstones, gallbladder wall thickening, or biliary dilatation. Pancreas: Unremarkable. No pancreatic ductal dilatation or surrounding inflammatory changes. Spleen: Unremarkable Adrenals/Urinary Tract: Adrenal glands are unremarkable. Kidneys are normal, without renal calculi, focal lesion, or hydronephrosis. Bladder is unremarkable. Stomach/Bowel: A distal small bowel obstruction is present with a single point of transition noted just proximal to the ileocecal junction, best seen on axial image # 45, coronal image # 32, and sagittal image # 38. A focal mass is not clearly identified and an underlying adhesion is favored as an etiology. The proximal bowel is dilated, fluid-filled, and demonstrates fecalization of intraluminal contents. There is normal bowel enhancement. No free intraperitoneal gas. Trace free fluid within the pelvis. The appendix is unremarkable. The colon is unremarkable. Vascular/Lymphatic: No significant vascular findings are present. No enlarged abdominal or pelvic lymph nodes. Reproductive: Uterus and bilateral  adnexa are unremarkable. Other: No pathologic adenopathy within the abdomen and pelvis. Musculoskeletal: No acute or significant osseous findings. IMPRESSION: High-grade distal small-bowel obstruction just proximal to the ileocecal junction, likely related to an underlying adhesion. Trace free fluid within the pelvis. No evidence of perforation. Electronically  Signed   By: Helyn Numbers MD   On: 11/21/2020 01:21   DG Abd Portable 1V-Small Bowel Obstruction Protocol-initial, 8 hr delay  Result Date: 11/21/2020 CLINICAL DATA:  8 hour delay film for possible small bowel obstruction EXAM: PORTABLE ABDOMEN - 1 VIEW COMPARISON:  CT and plain film from earlier in the same day. FINDINGS: Scattered large and small bowel gas is noted. Fecal material is noted throughout the colon. Previously administered contrast lies predominately within the stomach. Minimal contrast is noted within the small bowel. No colonic contrast is seen. Follow-up examination is recommended. IMPRESSION: Administered contrast is predominately within the stomach. Mild contrast in the small bowel is noted. No colonic contrast is seen. Continued follow-up is recommended. Electronically Signed   By: Alcide Clever M.D.   On: 11/21/2020 16:15   DG Abd Portable 1V-Small Bowel Protocol-Position Verification  Result Date: 11/21/2020 CLINICAL DATA:  Initial evaluation for NG tube placement. EXAM: PORTABLE ABDOMEN - 1 VIEW COMPARISON:  None. FINDINGS: Enteric tube in place with tip overlying the gastric body, side hole beyond the GE junction. Mild gaseous distension of the stomach noted. Scattered dilated gas-filled loops of bowel noted within the upper abdomen, consistent with previously identified small bowel obstruction. Visualized lungs are clear. IMPRESSION: Tip of enteric tube overlying the gastric body, side hole beyond the GE junction. Electronically Signed   By: Rise Mu M.D.   On: 11/21/2020 01:56    Anti-infectives: Anti-infectives (From admission, onward)   None       Assessment/Plan COVID infection - currently asymptomatic  SBO - hx of cesarean section  - pain improving  - 8h film showed contrast mostly in stomach - repeat film this AM - if contrast has passed to colon will try clamping and CLD, if not continue NGT on LIWS and repeat film tomorrow - ok to ambulate in  room - ok to have some ice chips for comfort   FEN: ice chips, IVF, NGT to LIWS VTE: lovenox ID: no current abx  LOS: 1 day    Juliet Rude , [facility] Surgery 11/22/2020, 8:42 AM Please see Amion for pager number during day hours 7:00am-4:30pm

## 2020-11-22 NOTE — Progress Notes (Signed)
RN was reviewing patient's COVID test and test was positive, never received a call from lab to let us know. Lab was called to verify positive covid results. Trey Paula the [facility] was notified. Precautions were started.

## 2020-11-22 NOTE — Progress Notes (Signed)
NG tube pulled per orders @ 1545. Patient tolerated well. Husband at bedside. Denies needs at this time.

## 2020-11-23 DIAGNOSIS — U071 COVID-19: Secondary | ICD-10-CM

## 2020-11-23 LAB — GLUCOSE, CAPILLARY: Glucose-Capillary: 80 mg/dL (ref 70–99)

## 2020-11-23 MED ORDER — SODIUM CHLORIDE 0.9 % IV SOLN: 250.0000 mL | INTRAVENOUS | Status: DC | PRN

## 2020-11-23 MED ORDER — SODIUM CHLORIDE 0.9% FLUSH
3.0000 mL | Freq: Two times a day (BID) | INTRAVENOUS | Status: DC
  Administered 2020-11-23 (×2): 3 mL via INTRAVENOUS

## 2020-11-23 MED ORDER — SODIUM CHLORIDE 0.9% FLUSH: 3.0000 mL | INTRAVENOUS | Status: DC | PRN

## 2020-11-23 MED ORDER — LACTATED RINGERS IV BOLUS: 1000.0000 mL | Freq: Three times a day (TID) | INTRAVENOUS | Status: DC | PRN

## 2020-11-23 NOTE — Progress Notes (Signed)
During bedside rounding per protocol, noted IV leaking. With assessment, small infiltration noted. IVF's were stopped. IV site removed, catheter intact. Cleansed and elevated RUA onto pillow. Patients denies pain. Placed order for restart with IV team and informed patient.    Patient has been voiding , Active bowel sounds noted. RLQ slightly tender. Phone and call light in reach. Awaiting IV team for restart.

## 2020-11-23 NOTE — Progress Notes (Signed)
Cheryl Hanna [id number] 04/05/1966  CARE TEAM:  PCP: Patient, No Pcp Per  Outpatient Care Team: Patient Care Team: Patient, No Pcp Per as PCP - General (General Practice) Patton Salles, MD as Consulting Physician (Obstetrics and Gynecology)  Inpatient Treatment Team: Treatment Team: Attending Provider: Montez Morita, Md, MD; Consulting Physician: Montez Morita, Md, MD; Utilization Review: Clydia Llano, RN; Registered Nurse: Saunders Glance, RN   Problem List:   Principal Problem:   SBO (small bowel obstruction) [facility]) Active Problems:   Abdominal cramping      * No surgery found *      Assessment  Bowel obstruction seems to be resolving nonoperatively  [facility] Stay = 2 days)  Assessment/Plan COVID infection - currently asymptomatic  SBO - hx of cesarean section  - pain improving  -Tolerating liquids.  Advance to full liquid diet.  Possibly to soft later today   FEN: Medlock IV fluids with as needed backup.  Advance diet. VTE: lovenox ID: no current abx  LOS: 1 day    Disposition:  Disposition:  The patient is from: Home  Anticipate discharge to:  Home  Anticipated Date of Discharge is:  November 24, 2020  Barriers to discharge:  Pending Clinical improvement (more likely than not  Patient currently is NOT MEDICALLY STABLE for discharge from the hospital from a surgery standpoint.   25 minutes spent in review, evaluation, examination, counseling, and coordination of care.  More than 50% of that time was spent in counseling.  11/23/2020    Subjective: (Chief complaint)  Feeling much better overall.  Tolerated liquids.  Nursing in room.  Objective:  Vital signs:  Vitals:   11/22/20 0610 11/22/20 1322 11/22/20 2104 11/23/20 0633  BP: (!) 157/95 (!) 148/93 130/83 134/75  Pulse: 66 64 70 (!) 57  Resp: 17 16 16 14   Temp: 98.2 F (36.8 C)  98.5 F (36.9 C) 98.6 F (37 C)  TempSrc: Oral  Oral Oral  SpO2: 99% 98% 98% 100%  Weight:       Height:        Last BM Date: 11/22/20  Intake/Output   Yesterday:  01/07 0701 - 01/08 0700 In: 2475.6 [P.O.:360; I.V.:2065.6; NG/GT:50] Out: -  This shift:  No intake/output data recorded.  Bowel function:  Flatus: YES  BM:  No  Drain: (No drain)   Physical Exam:  General: Pt awake/alert in no acute distress Eyes: PERRL, normal EOM.  Sclera clear.  No icterus Neuro: CN II-XII intact w/o focal sensory/motor deficits. Lymph: No head/neck/groin lymphadenopathy Psych:  No delerium/psychosis/paranoia.  Oriented x 4 HENT: Normocephalic, Mucus membranes moist.  No thrush Neck: Supple, No tracheal deviation.  No obvious thyromegaly Chest: No pain to chest wall compression.  Good respiratory excursion.  No audible wheezing CV:  Pulses intact.  Regular rhythm.  No major extremity edema MS: Normal AROM mjr joints.  No obvious deformity  Abdomen: Soft.  Mildy distended.  Nontender.  No evidence of peritonitis.  No incarcerated hernias.  Ext:  No deformity.  No mjr edema.  No cyanosis Skin: No petechiae / purpurea.  No major sores.  Warm and dry    Results:   Cultures: Recent Results (from the past 720 hour(s))  SARS CORONAVIRUS 2 (TAT 6-24 HRS) Nasopharyngeal Nasopharyngeal Swab     Status: Abnormal   Collection Time: 11/21/20  1:34 AM   Specimen: Nasopharyngeal Swab  Result Value Ref Range Status   SARS Coronavirus 2 POSITIVE (A) NEGATIVE Final  Comment: Emailed Lori Berdik @ 2340 on 11/21/2020 by Elenor Quinones (NOTE) SARS-CoV-2 target nucleic acids are DETECTED.  The SARS-CoV-2 RNA is generally detectable in upper and lower respiratory specimens during the acute phase of infection. Positive results are indicative of the presence of SARS-CoV-2 RNA. Clinical correlation with patient history and other diagnostic information is  necessary to determine patient infection status. Positive results do not rule out bacterial infection or co-infection with other viruses.  The  expected result is Negative.  Fact Sheet for Patients: HairSlick.no  Fact Sheet for Healthcare Providers: quierodirigir.com  This test is not yet approved or cleared by the [place] FDA and  has been authorized for detection and/or diagnosis of SARS-CoV-2 by FDA under an Emergency Use Authorization (EUA). This EUA will remain  in effect (meaning this test can be used) for the duration of the COVID-19 d eclaration under Section 564(b)(1) of the Act, 21 U.S.C. section 360bbb-3(b)(1), unless the authorization is terminated or revoked sooner.   Performed at [facility], [address] [place], [place] [address]     Labs: Results for orders placed or performed during the hospital encounter of 11/21/20 (from the past 48 hour(s))  Basic metabolic panel     Status: Abnormal   Collection Time: 11/22/20  5:39 AM  Result Value Ref Range   Sodium 138 135 - 145 mmol/L   Potassium 3.8 3.5 - 5.1 mmol/L   Chloride 104 98 - 111 mmol/L   CO2 26 22 - 32 mmol/L   Glucose, Bld 103 (H) 70 - 99 mg/dL    Comment: Glucose reference range applies only to samples taken after fasting for at least 8 hours.   BUN 11 6 - 20 mg/dL   Creatinine, Ser 1.75 0.44 - 1.00 mg/dL   Calcium 8.7 (L) 8.9 - 10.3 mg/dL   GFR, Estimated >10 >25 mL/min    Comment: (NOTE) Calculated using the CKD-EPI Creatinine Equation (2021)    Anion gap 8 5 - 15    Comment: Performed at [facility], [address] [place], [place] [address]  CBC     Status: None   Collection Time: 11/22/20  5:39 AM  Result Value Ref Range   WBC 6.7 4.0 - 10.5 K/uL   RBC 4.03 3.87 - 5.11 MIL/uL   Hemoglobin 13.1 12.0 - 15.0 g/dL   HCT 82.4 23.5 - 36.1 %   MCV 95.8 80.0 - 100.0 fL   MCH 32.5 26.0 - 34.0 pg   MCHC 33.9 30.0 - 36.0 g/dL   RDW 44.3 15.4 - 00.8 %   Platelets 200 150 - 400 K/uL   nRBC 0.0 0.0 - 0.2 %    Comment: Performed at [facility], [address] [place], [place] [address]  Magnesium     Status: None   Collection Time: 11/22/20  5:39 AM  Result Value Ref Range   Magnesium 1.9 1.7 - 2.4 mg/dL    Comment: Performed at [facility], [address] [place], [place] [address]  Glucose, capillary     Status: None   Collection Time: 11/23/20  8:04 AM  Result Value Ref Range   Glucose-Capillary 80 70 - 99 mg/dL    Comment: Glucose reference range applies only to samples taken after fasting for at least 8 hours.    Imaging / Studies: DG Abd Portable 1V  Result Date: 11/22/2020 CLINICAL DATA:  Follow-up small bowel obstruction. EXAM: PORTABLE ABDOMEN -  1 VIEW COMPARISON:  November 21, 2020 CT and plain film evaluation. Marland Kitchen FINDINGS: Mild RIGHT lower lobe airspace disease since the prior CT from January 6th. Gastric tube in the stomach tip in the first portion of the duodenum or pylorus. Small bowel loops remain distended though there is abundant contrast throughout the colon. No acute skeletal process on limited assessment. IMPRESSION: 1. Gastric tube coiled in the stomach tip in the first portion of the duodenum or pylorus. 2. Small bowel loops remain distended. Administered contrast has passed into the colon. Findings could represent partial small bowel obstruction or resolving ileus. 3. RIGHT lower lobe airspace disease may represent atelectasis. Correlate with any respiratory symptoms. Electronically Signed   By: Donzetta Kohut M.D.   On: 11/22/2020 09:58   DG Abd Portable 1V-Small Bowel Obstruction Protocol-initial, 8 hr delay  Result Date: 11/21/2020 CLINICAL DATA:  8 hour delay film for possible small bowel obstruction EXAM: PORTABLE ABDOMEN - 1 VIEW COMPARISON:  CT and plain film from earlier in the same day. FINDINGS: Scattered large and small bowel gas is noted. Fecal material is noted throughout the colon. Previously administered contrast lies predominately within the stomach. Minimal  contrast is noted within the small bowel. No colonic contrast is seen. Follow-up examination is recommended. IMPRESSION: Administered contrast is predominately within the stomach. Mild contrast in the small bowel is noted. No colonic contrast is seen. Continued follow-up is recommended. Electronically Signed   By: Alcide Clever M.D.   On: 11/21/2020 16:15    Medications / Allergies: per chart  Antibiotics: Anti-infectives (From admission, onward)   None        Note: Portions of this report may have been transcribed using voice recognition software. Every effort was made to ensure accuracy; however, inadvertent computerized transcription errors may be present.   Any transcriptional errors that result from this process are unintentional.    Ardeth Sportsman, MD, FACS, MASCRS Gastrointestinal and Minimally Invasive Surgery  [facility] [address] [place], [place] [address] [phone] Fax [phone] Main/Paging  CONTACT INFORMATION: Weekday (9AM-5PM) concerns: Call [facility] main office at [phone] Weeknight (5PM-9AM) or Weekend/Holiday concerns: Check [email or web address] for General Surgery [facility] coverage (Please, do not use SecureChat as it is not reliable communication to operating surgeons for immediate patient care)      11/23/2020  11:34 AM

## 2020-11-23 NOTE — Plan of Care (Signed)

## 2020-11-24 NOTE — Discharge Instructions (Signed)
Bowel Obstruction A bowel obstruction means that something is blocking the small or large bowel. The bowel is also called the intestine. It is the long tube that connects the stomach to the opening of the butt (anus). When something blocks the bowel, food and fluids cannot pass through like normal. This condition needs to be treated. Treatment depends on the cause of the problem and how bad the problem is. What are the causes? Common causes of this condition include:  Scar tissue (adhesions) from past surgery or from high-energy X-rays (radiation).  Recent surgery in the belly. This affects how food moves in the bowel.  Some diseases, such as: ? Irritation of the lining of the digestive tract (Crohn's disease). ? Irritation of small pouches in the bowel (diverticulitis).  Growths or tumors.  A bulging organ (hernia).  Twisting of the bowel (volvulus).  A foreign body.  Slipping of a part of the bowel into another part (intussusception). What are the signs or symptoms? Symptoms of this condition include:  Pain in the belly.  Feeling sick to your stomach (nauseous).  Throwing up (vomiting).  Bloating in the belly.  Being unable to pass gas.  Trouble pooping (constipation).  Watery poop (diarrhea).  A lot of belching. How is this diagnosed? This condition may be diagnosed based on:  A physical exam.  Medical history.  Imaging tests, such as X-ray or CT scan.  Blood tests.  Urine tests. How is this treated? Treatment for this condition may include:  Fluids and pain medicines that are given through an IV tube. Your doctor may tell you not to eat or drink if you feel sick to your stomach and are throwing up.  Eating a clear liquid diet for a few days.  Putting a small tube (nasogastric tube) into the stomach. This will help with pain, discomfort, and nausea by removing blocked air and fluids from the stomach.  Surgery. This may be needed if other treatments do  not work. Follow these instructions at home: Medicines  Take over-the-counter and prescription medicines only as told by your doctor.  If you were prescribed an antibiotic medicine, take it as told by your doctor. Do not stop taking the antibiotic even if you start to feel better. General instructions  Follow your diet as told by your doctor. You may need to: ? Only drink clear liquids until you start to get better. ? Avoid solid foods.  Return to your normal activities as told by your doctor. Ask your doctor what activities are safe for you.  Do not sit for a long time without moving. Get up to take short walks every 1-2 hours. This is important. Ask for help if you feel weak or unsteady.  Keep all follow-up visits as told by your doctor. This is important. How is this prevented? After having a bowel obstruction, you may be more likely to have another. You can do some things to stop it from happening again.  If you have a long-term (chronic) disease, contact your doctor if you see changes or problems.  Take steps to prevent or treat trouble pooping. Your doctor may ask that you: ? Drink enough fluid to keep your pee (urine) pale yellow. ? Take over-the-counter or prescription medicines. ? Eat foods that are high in fiber. These include beans, whole grains, and fresh fruits and vegetables. ? Limit foods that are high in fat and sugar. These include fried or sweet foods.  Stay active. Ask your doctor which exercises are  safe for you.  Avoid stress.  Eat three small meals and three small snacks each day.  Work with a Psychologist, prison and probation services (dietitian) to make a meal plan that works for you.  Do not use any products that contain nicotine or tobacco, such as cigarettes and e-cigarettes. If you need help quitting, ask your doctor. Contact a doctor if:  You have a fever.  You have chills. Get help right away if:  You have pain or cramps that get worse.  You throw up blood.  You are  sick to your stomach.  You cannot stop throwing up.  You cannot drink fluids.  You feel mixed up (confused).  You feel very thirsty (dehydrated).  Your belly gets more bloated.  You feel weak or you pass out (faint). Summary  A bowel obstruction means that something is blocking the small or large bowel.  Treatment may include IV fluids and pain medicine. You may also have a clear liquid diet, a small tube in your stomach, or surgery.  Drink clear liquids and avoid solid foods until you get better. This information is not intended to replace advice given to you by your health care provider. Make sure you discuss any questions you have with your health care provider. Document Revised: 03/16/2018 Document Reviewed: 03/16/2018 Elsevier Patient Education  2020 [employer].     Soft diet.  Avoid large bulky meals  Small frequent meals for the next 2 weeks advised  Drink plenty of water and avoid alcohol  Walk every day  If you develop cough, fever, chills, malaise, nausea or vomiting this may be from your Covid infection.  Please contact primary care if the symptoms start  No surgical management or follow-up needed at this point as long as you remain asymptomatic

## 2020-11-24 NOTE — Progress Notes (Signed)
   Subjective/Chief Complaint: Patient feels welL tolerating diet.  Bowels are moving.  No significant abdominal pain.  No difficulty breathing.   Objective: Vital signs in last 24 hours: Temp:  [97.9 F (36.6 C)-98.1 F (36.7 C)] 98 F (36.7 C) (01/09 0501) Pulse Rate:  [52-78] 58 (01/09 0501) Resp:  [16-20] 16 (01/09 0501) BP: (131-149)/(82-88) 131/87 (01/09 0501) SpO2:  [98 %-100 %] 98 % (01/09 0501) Last BM Date: 11/23/20  Intake/Output from previous day: 01/08 0701 - 01/09 0700 In: 714.3 [P.O.:120; I.V.:594.3] Out: -  Intake/Output this shift: No intake/output data recorded.  General: pleasant, WD, thin female who is laying in bed in NAD Lungs:  Respiratory effort nonlabored Abd: soft, mildly ttp in RLQ, no peritonitis, ND   Lab Results:  Recent Labs    11/22/20 0539  WBC 6.7  HGB 13.1  HCT 38.6  PLT 200   BMET Recent Labs    11/22/20 0539  NA 138  K 3.8  CL 104  CO2 26  GLUCOSE 103*  BUN 11  CREATININE 0.59  CALCIUM 8.7*   PT/INR No results for input(s): LABPROT, INR in the last 72 hours. ABG No results for input(s): PHART, HCO3 in the last 72 hours.  Invalid input(s): PCO2, PO2  Studies/Results: DG Abd Portable 1V  Result Date: 11/22/2020 CLINICAL DATA:  Follow-up small bowel obstruction. EXAM: PORTABLE ABDOMEN - 1 VIEW COMPARISON:  November 21, 2020 CT and plain film evaluation. Marland Kitchen FINDINGS: Mild RIGHT lower lobe airspace disease since the prior CT from January 6th. Gastric tube in the stomach tip in the first portion of the duodenum or pylorus. Small bowel loops remain distended though there is abundant contrast throughout the colon. No acute skeletal process on limited assessment. IMPRESSION: 1. Gastric tube coiled in the stomach tip in the first portion of the duodenum or pylorus. 2. Small bowel loops remain distended. Administered contrast has passed into the colon. Findings could represent partial small bowel obstruction or resolving ileus. 3.  RIGHT lower lobe airspace disease may represent atelectasis. Correlate with any respiratory symptoms. Electronically Signed   By: Donzetta Kohut M.D.   On: 11/22/2020 09:58    Anti-infectives: Anti-infectives (From admission, onward)   None      Assessment/Plan:  COVID infection - currently asymptomatic-contact primary care if you develop cough, fever, chills, malaise, nausea or vomiting for management  SBO - hx of cesarean section  -Tolerating diet, bowels moving and no pain.  Discharge home  No surgical follow-up necessary  Diet instructions given as well as activity level. - LOS: 3 days    Clovis Pu Pollie Poma 11/24/2020

## 2020-11-24 NOTE — Plan of Care (Signed)

## 2020-11-24 NOTE — Plan of Care (Signed)
  Problem: Clinical Measurements: Goal: Respiratory complications will improve Outcome: Adequate for Discharge   Problem: Clinical Measurements: Goal: Cardiovascular complication will be avoided Outcome: Adequate for Discharge   

## 2020-11-24 NOTE — Discharge Summary (Signed)
Physician Discharge Summary  Patient ID: Cheryl Hanna MRN: [id number] DOB/AGE: 05/04/1966 55 y.o.  Admit date: 11/21/2020 Discharge date: 11/24/2020  Admission Diagnoses: Small bowel obstruction  Discharge Diagnoses:  Principal Problem:   SBO (small bowel obstruction) (HCC) Active Problems:   Abdominal cramping   COVID-19 virus infection   Discharged Condition: good  Hospital Course: Patient admitted.  She was started on nonoperative management with an NG tube and small bowel protocol.  Her contrast began to fill her colon within 24 to 48 hours.  Her bowels began to move.  Her abdominal pain improved over the next 2 days and diet was advanced.  She remained asymptomatic from a Covid standpoint.  By hospital day 3 she was eating regular diet, having bowel movements and no pain.  She was discharged home in good condition.  Consults: None    Treatments: IV hydration  Discharge Exam: Blood pressure 131/87, pulse (!) 58, temperature 98 F (36.7 C), temperature source Oral, resp. rate 16, height 5' (1.524 m), weight 49.9 kg, last menstrual period 09/20/2020, SpO2 98 %. General appearance: alert and cooperative Resp: clear to auscultation bilaterally Cardio: regular rate and rhythm, S1, S2 normal, no murmur, click, rub or gallop GI: soft, non-tender; bowel sounds normal; no masses,  no organomegaly Extremities: No extremity edema noted nontender calves  Disposition: Home  Follow-up with primary care as needed  No surgical follow-up necessary unless symptoms recur      Signed: Dortha Schwalbe MD 11/24/2020, 9:37 AM

## 2020-11-24 NOTE — Plan of Care (Signed)
  Problem: Education: Goal: Knowledge of General Education information will improve Description: Including pain rating scale, medication(s)/side effects and non-pharmacologic comfort measures 11/24/2020 1200 by Loel Lofty, RN Outcome: Adequate for Discharge 11/24/2020 1157 by Loel Lofty, RN Outcome: Adequate for Discharge   Problem: Clinical Measurements: Goal: Ability to maintain clinical measurements within normal limits will improve 11/24/2020 1200 by Loel Lofty, RN Outcome: Adequate for Discharge 11/24/2020 1157 by Loel Lofty, RN Outcome: Adequate for Discharge

## 2020-12-24 ENCOUNTER — Encounter: Payer: Self-pay | Admitting: Internal Medicine

## 2020-12-24 ENCOUNTER — Other Ambulatory Visit: Payer: Self-pay

## 2020-12-24 ENCOUNTER — Ambulatory Visit: Payer: 59 | Admitting: Internal Medicine

## 2020-12-24 VITALS — BP 142/94 | HR 67 | Temp 97.6°F | Ht 60.0 in | Wt 105.0 lb

## 2020-12-24 DIAGNOSIS — I119 Hypertensive heart disease without heart failure: Secondary | ICD-10-CM

## 2020-12-24 DIAGNOSIS — Z0000 Encounter for general adult medical examination without abnormal findings: Secondary | ICD-10-CM

## 2020-12-24 DIAGNOSIS — I998 Other disorder of circulatory system: Secondary | ICD-10-CM | POA: Diagnosis not present

## 2020-12-24 DIAGNOSIS — Z23 Encounter for immunization: Secondary | ICD-10-CM | POA: Diagnosis not present

## 2020-12-24 DIAGNOSIS — I10 Essential (primary) hypertension: Secondary | ICD-10-CM

## 2020-12-24 DIAGNOSIS — Z0001 Encounter for general adult medical examination with abnormal findings: Secondary | ICD-10-CM

## 2020-12-24 DIAGNOSIS — Z124 Encounter for screening for malignant neoplasm of cervix: Secondary | ICD-10-CM | POA: Insufficient documentation

## 2020-12-24 DIAGNOSIS — Z1211 Encounter for screening for malignant neoplasm of colon: Secondary | ICD-10-CM | POA: Insufficient documentation

## 2020-12-24 DIAGNOSIS — Z1231 Encounter for screening mammogram for malignant neoplasm of breast: Secondary | ICD-10-CM

## 2020-12-24 LAB — URINALYSIS, ROUTINE W REFLEX MICROSCOPIC
Bilirubin Urine: NEGATIVE
Hgb urine dipstick: NEGATIVE
Ketones, ur: 15 — AB
Leukocytes,Ua: NEGATIVE
Nitrite: NEGATIVE
RBC / HPF: NONE SEEN (ref 0–?)
Specific Gravity, Urine: 1.01 (ref 1.000–1.030)
Total Protein, Urine: NEGATIVE
Urine Glucose: NEGATIVE
Urobilinogen, UA: 0.2 (ref 0.0–1.0)
pH: 5.5 (ref 5.0–8.0)

## 2020-12-24 MED ORDER — EDARBYCLOR 40-12.5 MG PO TABS: 1.0000 | ORAL_TABLET | Freq: Every day | ORAL | 0 refills | Status: DC

## 2020-12-24 NOTE — Progress Notes (Addendum)
Subjective:  Patient ID: Cheryl Hanna, female    DOB: Jan 25, 1966  Age: 55 y.o. MRN: [id number]  CC: Annual Exam and Hypertension  This visit occurred during the SARS-CoV-2 public health emergency.  Safety protocols were in place, including screening questions prior to the visit, additional usage of staff PPE, and extensive cleaning of exam room while observing appropriate contact time as indicated for disinfecting solutions.    HPI Cheryl Hanna presents for a CPX and to establish.  She was recently admitted for SBO. She tells me the symptoms have resolved. She has a hx of HTN but is not taking an antihypertensive. She denies HA, BV, CP, DOE, edema, or fatigue.  History Cheryl Hanna has a past medical history of Hypertension.   She has a past surgical history that includes Cesarean section (1993); Tubal ligation; and Dilation and curettage of uterus.   Her family history includes Hypertension in her mother; Kidney disease in her sister.She reports that she has never smoked. She has never used smokeless tobacco. She reports current alcohol use of about 4.0 - 6.0 standard drinks of alcohol per week. She reports that she does not use drugs.  No outpatient medications prior to visit.   No facility-administered medications prior to visit.    ROS Review of Systems  Constitutional: Negative.  Negative for appetite change, diaphoresis, fatigue and unexpected weight change.  HENT: Negative.   Eyes: Negative.   Respiratory: Negative for cough, chest tightness, shortness of breath and wheezing.   Cardiovascular: Negative for chest pain, palpitations and leg swelling.  Gastrointestinal: Negative for abdominal pain, blood in stool, constipation, diarrhea, nausea and vomiting.  Endocrine: Negative.   Genitourinary: Negative.  Negative for difficulty urinating and dysuria.  Musculoskeletal: Negative for back pain.  Skin: Negative.   Neurological: Negative.  Negative for dizziness, weakness and  light-headedness.  Hematological: Negative for adenopathy. Does not bruise/bleed easily.  Psychiatric/Behavioral: Negative.     Objective:  BP (!) 142/94 Comment: BP (R) 170/116 (L) 146/90  Pulse 67   Temp 97.6 F (36.4 C) (Oral)   Ht 5' (1.524 m)   Wt 105 lb (47.6 kg)   SpO2 98%   BMI 20.51 kg/m   Physical Exam Vitals reviewed.  HENT:     Nose: Nose normal.     Mouth/Throat:     Mouth: Mucous membranes are moist.  Eyes:     General: No scleral icterus.    Conjunctiva/sclera: Conjunctivae normal.  Cardiovascular:     Rate and Rhythm: Normal rate and regular rhythm.     Heart sounds: Normal heart sounds, S1 normal and S2 normal. No murmur heard.  No systolic murmur is present.  No diastolic murmur is present. No gallop.      Comments: EKG - NSR, 61 bpm +LVH No Q waves or ST/T wave changes Pulmonary:     Effort: Pulmonary effort is normal.     Breath sounds: No stridor. No wheezing, rhonchi or rales.  Abdominal:     General: Abdomen is flat. Bowel sounds are normal. There is no distension.     Palpations: Abdomen is soft. There is no hepatomegaly, splenomegaly or mass.     Tenderness: There is no abdominal tenderness.  Musculoskeletal:     Cervical back: Neck supple.     Right lower leg: No edema.     Left lower leg: No edema.  Lymphadenopathy:     Cervical: No cervical adenopathy.  Neurological:     General: No focal deficit  present.     Mental Status: She is oriented to person, place, and time. Mental status is at baseline.  Psychiatric:        Mood and Affect: Mood normal.        Behavior: Behavior normal.     Lab Results  Component Value Date   WBC 6.7 11/22/2020   HGB 13.1 11/22/2020   HCT 38.6 11/22/2020   PLT 200 11/22/2020   GLUCOSE 103 (H) 11/22/2020   CHOL 194 12/24/2020   TRIG 50.0 12/24/2020   HDL 88.80 12/24/2020   LDLCALC 95 12/24/2020   ALT 30 11/20/2020   AST 30 11/20/2020   NA 138 11/22/2020   K 3.8 11/22/2020   CL 104 11/22/2020    CREATININE 0.59 11/22/2020   BUN 11 11/22/2020   CO2 26 11/22/2020   TSH 4.19 12/24/2020   HGBA1C 5.1 11/20/2020    Assessment & Plan:   Cheryl Hanna was seen today for annual exam and hypertension.  Diagnoses and all orders for this visit:  Cervical cancer screening -     Ambulatory referral to Gynecology  Visit for screening mammogram -     MM DIGITAL SCREENING BILATERAL; Future  Colon cancer screening -     Cologuard  Primary hypertension- Her BP is not well controlled. Will start an ARB and thiazide. Will screen for secondary causes and end organ damage. -     Aldosterone + renin activity w/ ratio; Future -     TSH; Future -     Urinalysis, Routine w reflex microscopic; Future -     VITAMIN D 25 Hydroxy (Vit-D Deficiency, Fractures); Future -     EKG 12-Lead -     Azilsartan-Chlorthalidone (EDARBYCLOR) 40-12.5 MG TABS; Take 1 tablet by mouth daily. -     VITAMIN D 25 Hydroxy (Vit-D Deficiency, Fractures) -     Urinalysis, Routine w reflex microscopic -     TSH -     Aldosterone + renin activity w/ ratio  Asymmetric blood pressures- The BP in in her right arm is 24 mmHG higher than the left. I have ordered a CT angio to see if there is coarctation of the aorta.  -     CT Angio Chest W/Cm &/Or Wo Cm; Future  Encounter for general adult medical examination with abnormal findings- Exam completed, labs reviewed, vaccines reviewed and updated, cancer screenings addressed. -     Lipid panel; Future -     Hepatitis C antibody; Future -     Hepatitis C antibody -     Lipid panel  LVH (left ventricular hypertrophy) due to hypertensive disease, without heart failure -     Azilsartan-Chlorthalidone (EDARBYCLOR) 40-12.5 MG TABS; Take 1 tablet by mouth daily.  Other orders -     Tdap vaccine greater than or equal to 7yo IM -     Flu Vaccine QUAD 6+ mos PF IM (Fluarix Quad PF)   I am having Cheryl Hanna start on Edarbyclor.  Meds ordered this encounter  Medications  .  Azilsartan-Chlorthalidone (EDARBYCLOR) 40-12.5 MG TABS    Sig: Take 1 tablet by mouth daily.    Dispense:  90 tablet    Refill:  0     Follow-up: Return in about 6 weeks (around 02/04/2021).  Cheryl Linger, MD

## 2020-12-24 NOTE — Patient Instructions (Signed)

## 2020-12-25 ENCOUNTER — Encounter: Payer: Self-pay | Admitting: Internal Medicine

## 2020-12-25 LAB — LIPID PANEL
Cholesterol: 194 mg/dL (ref 0–200)
HDL: 88.8 mg/dL (ref >39.00)
LDL Cholesterol: 95 mg/dL (ref 0–99)
NonHDL: 105.34
Total CHOL/HDL Ratio: 2
Triglycerides: 50 mg/dL (ref 0.0–149.0)
VLDL: 10 mg/dL (ref 0.0–40.0)

## 2020-12-25 LAB — VITAMIN D 25 HYDROXY (VIT D DEFICIENCY, FRACTURES): VITD: 40.47 ng/mL (ref 30.00–100.00)

## 2020-12-25 LAB — TSH: TSH: 4.19 u[IU]/mL (ref 0.35–4.50)

## 2020-12-30 ENCOUNTER — Ambulatory Visit
Admission: RE | Admit: 2020-12-30 | Discharge: 2020-12-30 | Disposition: A | Discharge status: Home / Self Care | Payer: 59 | Source: Ambulatory Visit | Attending: Internal Medicine | Admitting: Internal Medicine

## 2020-12-30 ENCOUNTER — Other Ambulatory Visit: Payer: Self-pay

## 2020-12-30 DIAGNOSIS — I998 Other disorder of circulatory system: Secondary | ICD-10-CM

## 2020-12-30 MED ORDER — IOPAMIDOL (ISOVUE-370) INJECTION 76%
75.0000 mL | Freq: Once | INTRAVENOUS | Status: AC | PRN
  Administered 2020-12-30: 75 mL via INTRAVENOUS

## 2021-01-06 LAB — ALDOSTERONE + RENIN ACTIVITY W/ RATIO
ALDO / PRA Ratio: 5.4 Ratio (ref 0.9–28.9)
Aldosterone: 2 ng/dL
Renin Activity: 0.37 ng/mL/h (ref 0.25–5.82)

## 2021-01-06 LAB — HEPATITIS C ANTIBODY
Hepatitis C Ab: NONREACTIVE
SIGNAL TO CUT-OFF: 0.01 (ref <1.00)

## 2021-01-07 ENCOUNTER — Encounter: Payer: Self-pay | Admitting: Nurse Practitioner

## 2021-01-07 ENCOUNTER — Ambulatory Visit: Payer: 59 | Admitting: Nurse Practitioner

## 2021-01-07 ENCOUNTER — Other Ambulatory Visit: Payer: Self-pay

## 2021-01-07 VITALS — BP 116/60 | HR 76 | Ht 60.75 in | Wt 102.0 lb

## 2021-01-07 DIAGNOSIS — Z01419 Encounter for gynecological examination (general) (routine) without abnormal findings: Secondary | ICD-10-CM

## 2021-01-07 LAB — HM PAP SMEAR

## 2021-01-07 NOTE — Addendum Note (Signed)
Addended by: Lum Babe on: 01/07/2021 03:51 PM   Modules accepted: Orders

## 2021-01-07 NOTE — Progress Notes (Signed)
    AZRIEL DANCY [birth year] [id number]   History:  55 y.o. [id number] presents as new patient to establish care. No GYN complaints. Has began having irregular cycles ranging from 1-3 months, no menopausal symptoms. Normal pap history although most recent was about 10 years ago per patient. Has not had screening mammogram, scheduled for next month. HTN managed by PCP.   Gynecologic History Patient's last menstrual period was 01/02/2021.   Contraception/Family planning: none  Health Maintenance Last Pap: >10 years ago Last mammogram: Never. Scheduled for 02/13/2021 Last colonoscopy: Never  Last Dexa: N/A  Past medical history, past surgical history, family history and social history were all reviewed and documented in the EPIC chart.  ROS:  A ROS was performed and pertinent positives and negatives are included.  Exam:  Vitals:   01/07/21 1352  BP: 116/60  Pulse: 76  Weight: 102 lb (46.3 kg)  Height: 5' 0.75" (1.543 m)   Body mass index is 19.43 kg/m.  General appearance:  Normal Thyroid:  Symmetrical, normal in size, without palpable masses or nodularity. Respiratory  Auscultation:  Clear without wheezing or rhonchi Cardiovascular  Auscultation:  Regular rate, without rubs, murmurs or gallops  Edema/varicosities:  Not grossly evident Abdominal  Soft,nontender, without masses, guarding or rebound.  Liver/spleen:  No organomegaly noted  Hernia:  None appreciated  Skin  Inspection:  Grossly normal   Breasts: Examined lying and sitting.   Right: Without masses, retractions, discharge or axillary adenopathy.   Left: Without masses, retractions, discharge or axillary adenopathy. Gentitourinary   Inguinal/mons:  Normal without inguinal adenopathy  External genitalia:  Normal  BUS/Urethra/Skene's glands:  Normal  Vagina:  Normal  Cervix:  Normal  Uterus:  Normal in size, shape and contour.  Midline and mobile  Adnexa/parametria:     Rt: Without masses or  tenderness.   Lt: Without masses or tenderness.  Anus and perineum: Normal  Digital rectal exam: Normal sphincter tone without palpated masses or tenderness  Assessment/Plan:  55 y.o. [id number] for annual exam.   Well female exam with routine gynecological exam - Education provided on SBEs, importance of preventative screenings, current guidelines, high calcium diet, regular exercise, and multivitamin daily. Labs with PCP.   Screening for cervical cancer - Normal Pap history although it has been about 10 years ago since last one. Pap with HR HPV today.   Screening for breast cancer - Has not had screening mammogram. Scheduled for next month. Normal breast exam today.  Screening for colon cancer - Plans to do Cologuard.   Follow up in 1 year for annual.    Olivia Mackie [facility], 2:09 PM 01/07/2021

## 2021-01-07 NOTE — Patient Instructions (Signed)
Health Maintenance, Female Adopting a healthy lifestyle and getting preventive care are important in promoting health and wellness. Ask your health care provider about:  The right schedule for you to have regular tests and exams.  Things you can do on your own to prevent diseases and keep yourself healthy. What should I know about diet, weight, and exercise? Eat a healthy diet  Eat a diet that includes plenty of vegetables, fruits, low-fat dairy products, and lean protein.  Do not eat a lot of foods that are high in solid fats, added sugars, or sodium.   Maintain a healthy weight Body mass index (BMI) is used to identify weight problems. It estimates body fat based on height and weight. Your health care provider can help determine your BMI and help you achieve or maintain a healthy weight. Get regular exercise Get regular exercise. This is one of the most important things you can do for your health. Most adults should:  Exercise for at least 150 minutes each week. The exercise should increase your heart rate and make you sweat (moderate-intensity exercise).  Do strengthening exercises at least twice a week. This is in addition to the moderate-intensity exercise.  Spend less time sitting. Even light physical activity can be beneficial. Watch cholesterol and blood lipids Have your blood tested for lipids and cholesterol at 54 years of age, then have this test every 5 years. Have your cholesterol levels checked more often if:  Your lipid or cholesterol levels are high.  You are older than 54 years of age.  You are at high risk for heart disease. What should I know about cancer screening? Depending on your health history and family history, you may need to have cancer screening at various ages. This may include screening for:  Breast cancer.  Cervical cancer.  Colorectal cancer.  Skin cancer.  Lung cancer. What should I know about heart disease, diabetes, and high blood  pressure? Blood pressure and heart disease  High blood pressure causes heart disease and increases the risk of stroke. This is more likely to develop in people who have high blood pressure readings, are of African descent, or are overweight.  Have your blood pressure checked: ? Every 3-5 years if you are 18-39 years of age. ? Every year if you are 40 years old or older. Diabetes Have regular diabetes screenings. This checks your fasting blood sugar level. Have the screening done:  Once every three years after age 40 if you are at a normal weight and have a low risk for diabetes.  More often and at a younger age if you are overweight or have a high risk for diabetes. What should I know about preventing infection? Hepatitis B If you have a higher risk for hepatitis B, you should be screened for this virus. Talk with your health care provider to find out if you are at risk for hepatitis B infection. Hepatitis C Testing is recommended for:  Everyone born from 1945 through 1965.  Anyone with known risk factors for hepatitis C. Sexually transmitted infections (STIs)  Get screened for STIs, including gonorrhea and chlamydia, if: ? You are sexually active and are younger than 54 years of age. ? You are older than 54 years of age and your health care provider tells you that you are at risk for this type of infection. ? Your sexual activity has changed since you were last screened, and you are at increased risk for chlamydia or gonorrhea. Ask your health care provider   if you are at risk.  Ask your health care provider about whether you are at high risk for HIV. Your health care provider may recommend a prescription medicine to help prevent HIV infection. If you choose to take medicine to prevent HIV, you should first get tested for HIV. You should then be tested every 3 months for as long as you are taking the medicine. Pregnancy  If you are about to stop having your period (premenopausal) and  you may become pregnant, seek counseling before you get pregnant.  Take 400 to 800 micrograms (mcg) of folic acid every day if you become pregnant.  Ask for birth control (contraception) if you want to prevent pregnancy. Osteoporosis and menopause Osteoporosis is a disease in which the bones lose minerals and strength with aging. This can result in bone fractures. If you are 65 years old or older, or if you are at risk for osteoporosis and fractures, ask your health care provider if you should:  Be screened for bone loss.  Take a calcium or vitamin D supplement to lower your risk of fractures.  Be given hormone replacement therapy (HRT) to treat symptoms of menopause. Follow these instructions at home: Lifestyle  Do not use any products that contain nicotine or tobacco, such as cigarettes, e-cigarettes, and chewing tobacco. If you need help quitting, ask your health care provider.  Do not use street drugs.  Do not share needles.  Ask your health care provider for help if you need support or information about quitting drugs. Alcohol use  Do not drink alcohol if: ? Your health care provider tells you not to drink. ? You are pregnant, may be pregnant, or are planning to become pregnant.  If you drink alcohol: ? Limit how much you use to 0-1 drink a day. ? Limit intake if you are breastfeeding.  Be aware of how much alcohol is in your drink. In the [place] one drink equals one 12 oz bottle of beer (355 mL), one 5 oz glass of wine (148 mL), or one 1 oz glass of hard liquor (44 mL). General instructions  Schedule regular health, dental, and eye exams.  Stay current with your vaccines.  Tell your health care provider if: ? You often feel depressed. ? You have ever been abused or do not feel safe at home. Summary  Adopting a healthy lifestyle and getting preventive care are important in promoting health and wellness.  Follow your health care provider's instructions about healthy  diet, exercising, and getting tested or screened for diseases.  Follow your health care provider's instructions on monitoring your cholesterol and blood pressure. This information is not intended to replace advice given to you by your health care provider. Make sure you discuss any questions you have with your health care provider. Document Revised: 10/26/2018 Document Reviewed: 10/26/2018 Elsevier Patient Education  2021 [employer]  

## 2021-01-08 ENCOUNTER — Other Ambulatory Visit: Payer: Self-pay | Admitting: Internal Medicine

## 2021-01-08 DIAGNOSIS — Z1231 Encounter for screening mammogram for malignant neoplasm of breast: Secondary | ICD-10-CM

## 2021-01-08 LAB — PAP, TP IMAGING W/ HPV RNA, RFLX HPV TYPE 16,18/45: HPV DNA High Risk: NOT DETECTED

## 2021-01-11 LAB — COLOGUARD: Cologuard: NEGATIVE

## 2021-01-20 LAB — COLOGUARD
COLOGUARD: NEGATIVE
Cologuard: NEGATIVE

## 2021-02-04 ENCOUNTER — Ambulatory Visit: Payer: 59 | Admitting: Internal Medicine

## 2021-02-06 ENCOUNTER — Ambulatory Visit: Payer: 59 | Admitting: Internal Medicine

## 2021-02-13 ENCOUNTER — Other Ambulatory Visit: Payer: Self-pay

## 2021-02-13 ENCOUNTER — Ambulatory Visit
Admission: RE | Admit: 2021-02-13 | Discharge: 2021-02-13 | Disposition: A | Discharge status: Home / Self Care | Payer: 59 | Source: Ambulatory Visit

## 2021-02-13 DIAGNOSIS — Z1231 Encounter for screening mammogram for malignant neoplasm of breast: Secondary | ICD-10-CM

## 2021-12-19 IMAGING — CT CT ANGIO CHEST
2 of 3 series · 19 of 32 positions shown · IV contrast (APPLIED)
Comparison: None.

CLINICAL DATA: Runner with recent episode of Asymmetric blood
pressures. Hx of SBO

EXAM:
CT ANGIOGRAPHY CHEST WITH CONTRAST
TECHNIQUE: Multidetector CT imaging of the chest was performed using the
standard protocol during bolus administration of intravenous
contrast. Multiplanar CT image reconstructions and MIPs were
obtained to evaluate the vascular anatomy.
CONTRAST:  75mL I40X9C-5YD IOPAMIDOL (I40X9C-5YD) INJECTION 76%

[Series 10: thins 1.0 b31s · axial · 0.61mm/px · z∈[-309,-31]mm · 16 of 322 slices shown]
[im 22/322  lung]
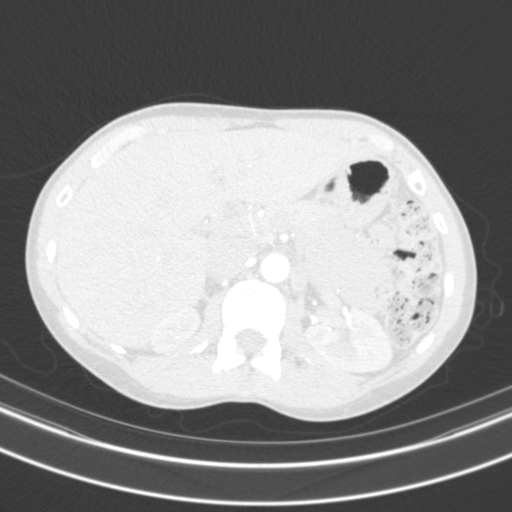
[im 43/322  mediastinal]
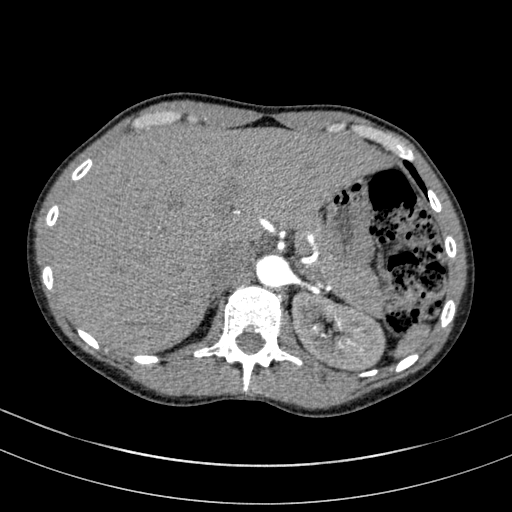
[im 65/322  lung]
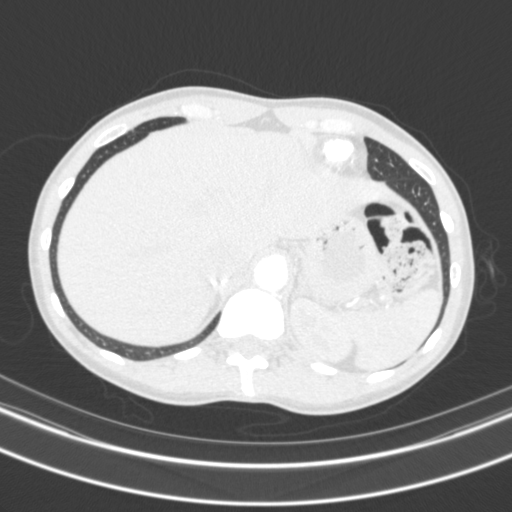
[im 86/322  mediastinal]
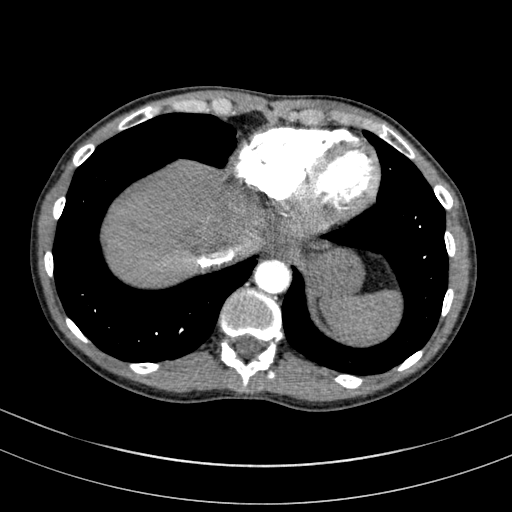
[im 108/322  lung]
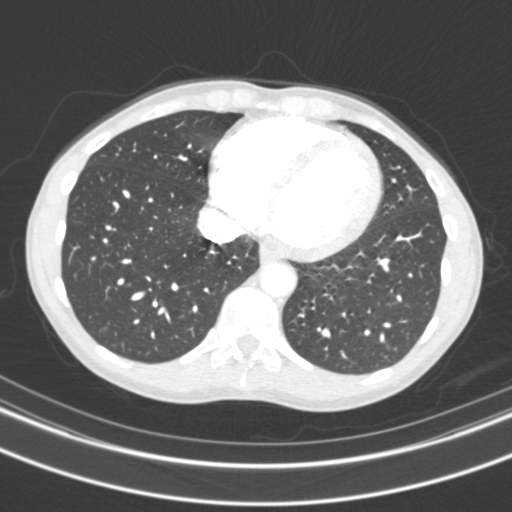
[im 129/322  mediastinal]
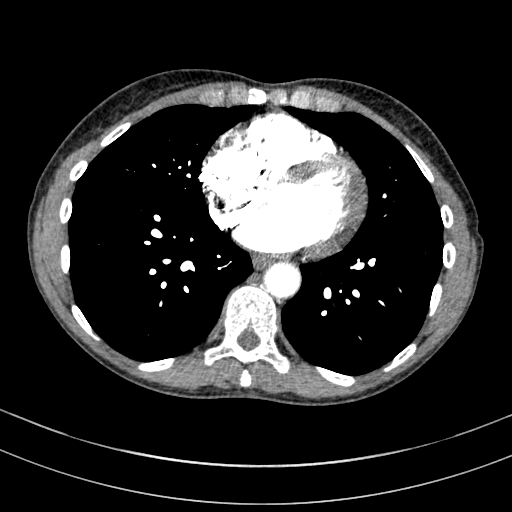
[im 150/322  lung]
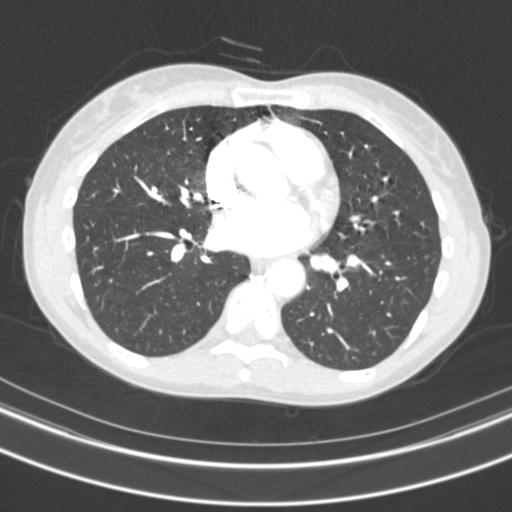
[im 152/322  mediastinal]
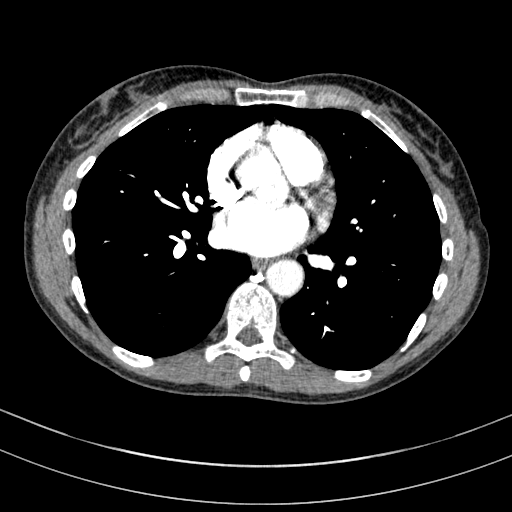
[im 161/322  lung]
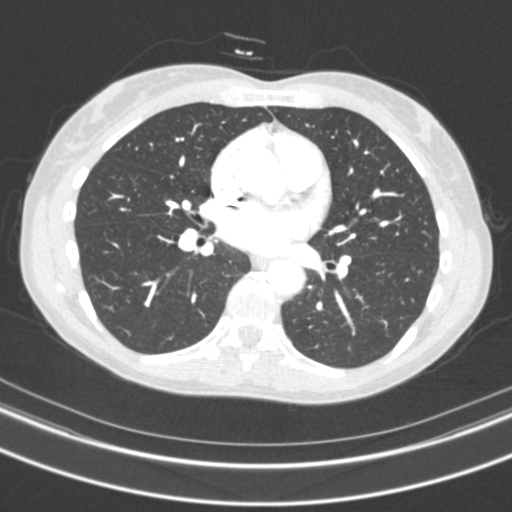
[im 172/322  mediastinal]
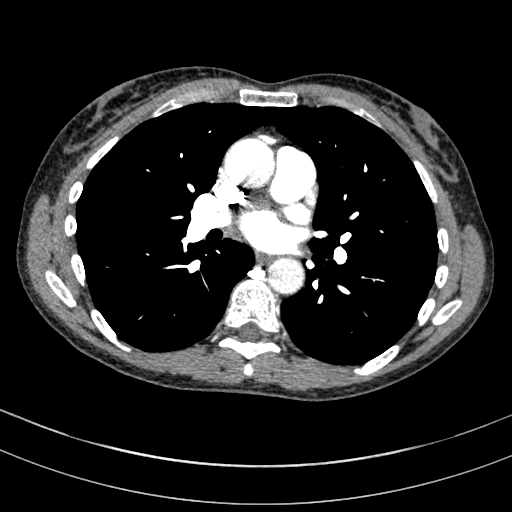
[im 193/322  lung]
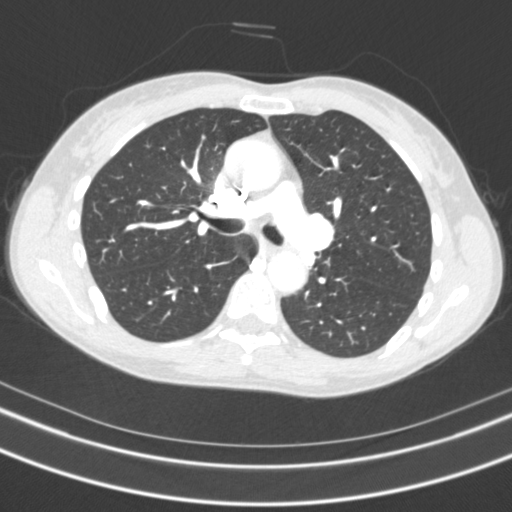
[im 215/322  mediastinal]
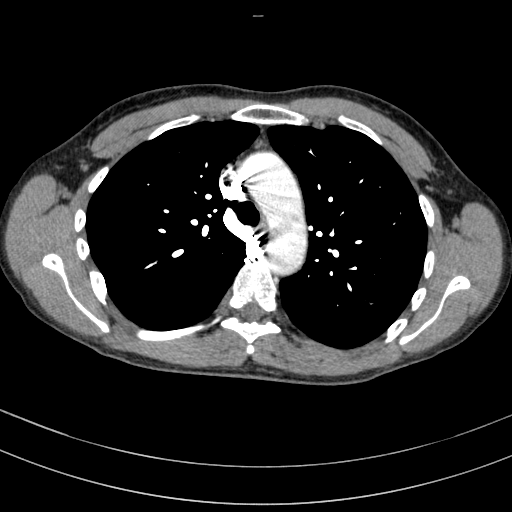
[im 236/322  lung]
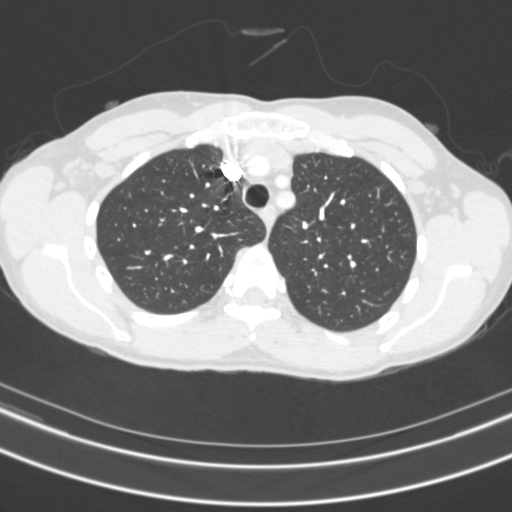
[im 257/322  mediastinal]
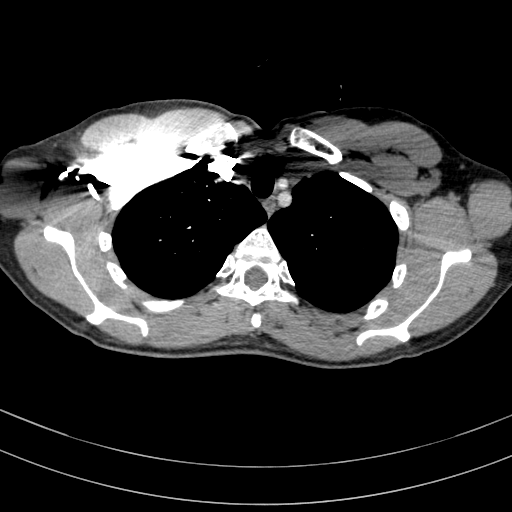
[im 279/322  lung]
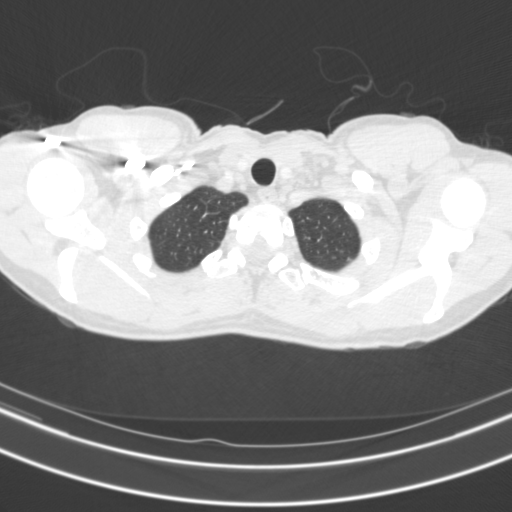
[im 300/322  mediastinal]
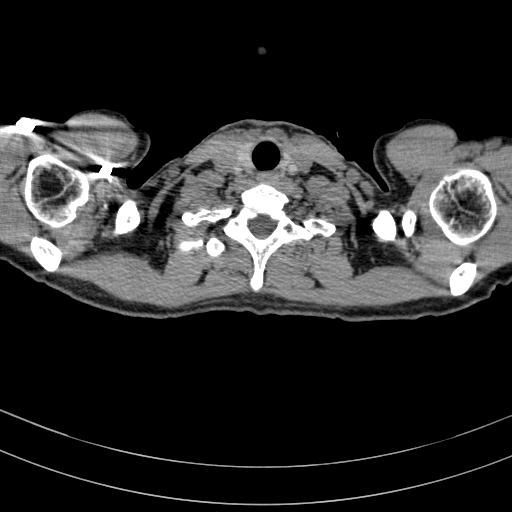

[Series 12: lung · axial · 0.61mm/px · z∈[-239,-179]mm · 3 of 139 slices shown]
[im 24/139  mediastinal]
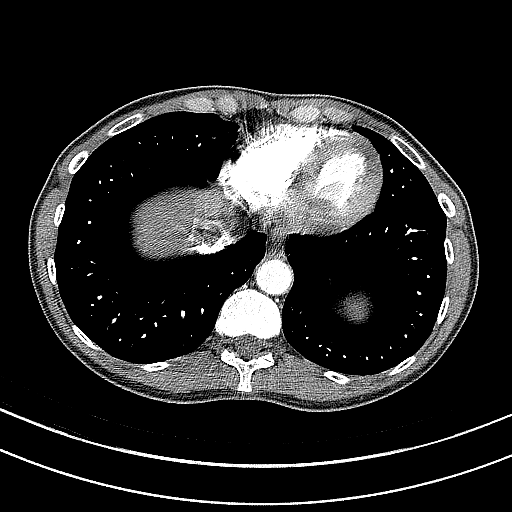
[im 47/139  mediastinal]
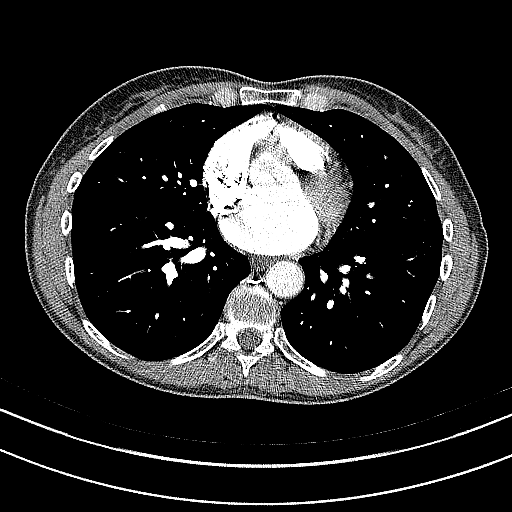
[im 54/139  mediastinal]
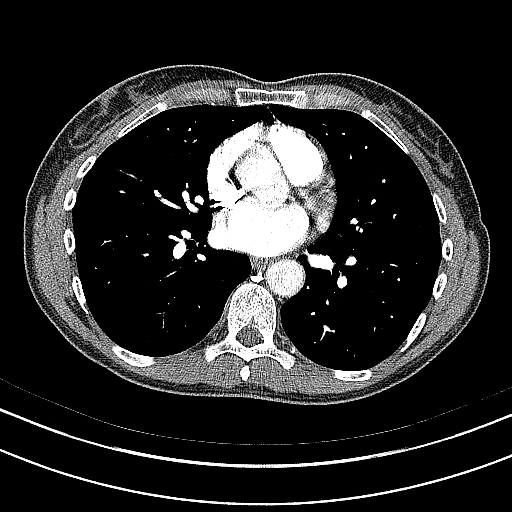

[19 of 32 positions shown; findings below may reference images not displayed]

FINDINGS: Cardiovascular: Preferential opacification of the thoracic aorta. No
evidence of thoracic aortic aneurysm or dissection. Normal heart
size. No pericardial effusion.

Mediastinum/Nodes: No enlarged mediastinal, hilar, or axillary lymph
nodes. Thyroid gland, trachea, and esophagus demonstrate no
significant findings.

Lungs/Pleura: Lungs are clear. No pleural effusion or pneumothorax.

Upper Abdomen: No acute abnormality.

Musculoskeletal: No chest wall abnormality. No acute or significant
osseous findings.

Review of the MIP images confirms the above findings.
IMPRESSION: Normal caliber of the thoracic aorta without evidence of thoracic
aortic aneurysm, dissection or coarctation.

## 2022-07-07 ENCOUNTER — Other Ambulatory Visit: Payer: Self-pay

## 2022-07-07 ENCOUNTER — Ambulatory Visit (INDEPENDENT_AMBULATORY_CARE_PROVIDER_SITE_OTHER): Payer: 59 | Admitting: Internal Medicine

## 2022-07-07 ENCOUNTER — Encounter: Payer: Self-pay | Admitting: Internal Medicine

## 2022-07-07 ENCOUNTER — Ambulatory Visit (INDEPENDENT_AMBULATORY_CARE_PROVIDER_SITE_OTHER): Payer: 59

## 2022-07-07 VITALS — BP 160/100 | HR 67 | Temp 98.0°F | Resp 17 | Ht 61.0 in | Wt 106.4 lb

## 2022-07-07 DIAGNOSIS — K5904 Chronic idiopathic constipation: Secondary | ICD-10-CM | POA: Diagnosis not present

## 2022-07-07 DIAGNOSIS — Z0001 Encounter for general adult medical examination with abnormal findings: Secondary | ICD-10-CM | POA: Diagnosis not present

## 2022-07-07 DIAGNOSIS — I10 Essential (primary) hypertension: Secondary | ICD-10-CM

## 2022-07-07 DIAGNOSIS — Z23 Encounter for immunization: Secondary | ICD-10-CM

## 2022-07-07 DIAGNOSIS — E875 Hyperkalemia: Secondary | ICD-10-CM

## 2022-07-07 DIAGNOSIS — I119 Hypertensive heart disease without heart failure: Secondary | ICD-10-CM | POA: Diagnosis not present

## 2022-07-07 DIAGNOSIS — Z1231 Encounter for screening mammogram for malignant neoplasm of breast: Secondary | ICD-10-CM

## 2022-07-07 LAB — HEPATIC FUNCTION PANEL
ALT: 26 U/L (ref 0–35)
AST: 33 U/L (ref 0–37)
Albumin: 4.7 g/dL (ref 3.5–5.2)
Alkaline Phosphatase: 63 U/L (ref 39–117)
Bilirubin, Direct: 0.1 mg/dL (ref 0.0–0.3)
Total Bilirubin: 0.7 mg/dL (ref 0.2–1.2)
Total Protein: 7.6 g/dL (ref 6.0–8.3)

## 2022-07-07 LAB — LIPID PANEL
Cholesterol: 187 mg/dL (ref 0–200)
HDL: 82.9 mg/dL (ref >39.00)
LDL Cholesterol: 95 mg/dL (ref 0–99)
NonHDL: 104.05
Total CHOL/HDL Ratio: 2
Triglycerides: 44 mg/dL (ref 0.0–149.0)
VLDL: 8.8 mg/dL (ref 0.0–40.0)

## 2022-07-07 LAB — URINALYSIS, ROUTINE W REFLEX MICROSCOPIC
Bilirubin Urine: NEGATIVE
Hgb urine dipstick: NEGATIVE
Leukocytes,Ua: NEGATIVE
Nitrite: NEGATIVE
RBC / HPF: NONE SEEN (ref 0–?)
Specific Gravity, Urine: 1.02 (ref 1.000–1.030)
Total Protein, Urine: NEGATIVE
Urine Glucose: NEGATIVE
Urobilinogen, UA: 0.2 (ref 0.0–1.0)
pH: 6.5 (ref 5.0–8.0)

## 2022-07-07 LAB — BASIC METABOLIC PANEL
BUN: 14 mg/dL (ref 6–23)
CO2: 28 mEq/L (ref 19–32)
Calcium: 10.3 mg/dL (ref 8.4–10.5)
Chloride: 101 mEq/L (ref 96–112)
Creatinine, Ser: 0.82 mg/dL (ref 0.40–1.20)
GFR: 80.85 mL/min (ref >60.00)
Glucose, Bld: 84 mg/dL (ref 70–99)
Potassium: 5.2 mEq/L — ABNORMAL HIGH (ref 3.5–5.1)
Sodium: 138 mEq/L (ref 135–145)

## 2022-07-07 LAB — MAGNESIUM: Magnesium: 1.9 mg/dL (ref 1.5–2.5)

## 2022-07-07 LAB — TSH: TSH: 2.61 u[IU]/mL (ref 0.35–5.50)

## 2022-07-07 MED ORDER — EDARBYCLOR 40-12.5 MG PO TABS: 1.0000 | ORAL_TABLET | Freq: Every day | ORAL | 0 refills | Status: DC

## 2022-07-07 MED ORDER — LUBIPROSTONE 24 MCG PO CAPS: 24.0000 ug | ORAL_CAPSULE | Freq: Two times a day (BID) | ORAL | 1 refills | Status: DC

## 2022-07-07 NOTE — Progress Notes (Signed)
Subjective:  Patient ID: Cheryl Hanna, female    DOB: [birth year]  Age: 56 y.o. MRN: [id number]  CC: Annual Exam (Patient is here for CPE) and Constipation (Patient states she has been having problems with Bowel movements for about a week.)   HPI Cheryl Hanna presents for a CPX and f/up -   She is a runner and denies chest pain, shortness of breath, diaphoresis, or edema.  She is no longer taking the antihypertensive.  It was prescribed a year and a half ago and she took it for 90 days but then stopped taking it.  She tolerated it well.  She complains of a 6-year history of intermittent constipation.  She has not gotten much symptom relief with over-the-counter remedies.  She tells me her last bowel movement was about a week ago but she denies bloating, pain, bright red blood per rectum, or melena.  She has rare cramping but her appetite is good.  Outpatient Medications Prior to Visit  Medication Sig Dispense Refill   Azilsartan-Chlorthalidone (EDARBYCLOR) 40-12.5 MG TABS Take 1 tablet by mouth daily. (Patient not taking: Reported on 07/07/2022) 90 tablet 0   No facility-administered medications prior to visit.    ROS Review of Systems  Constitutional:  Negative for chills, diaphoresis, fatigue and fever.  HENT: Negative.    Eyes: Negative.   Respiratory:  Negative for apnea, cough, chest tightness, shortness of breath and wheezing.   Cardiovascular:  Negative for chest pain, palpitations and leg swelling.  Gastrointestinal:  Positive for constipation. Negative for abdominal pain, blood in stool, diarrhea, nausea and vomiting.  Endocrine: Negative.   Genitourinary: Negative.  Negative for difficulty urinating and hematuria.  Musculoskeletal: Negative.  Negative for myalgias.  Skin: Negative.   Neurological: Negative.  Negative for dizziness, weakness, light-headedness and headaches.  Hematological:  Does not bruise/bleed easily.  Psychiatric/Behavioral: Negative.      Objective:   BP (!) 160/100   Pulse 67   Temp 98 F (36.7 C) (Oral)   Resp 17   Ht 5\' 1"  (1.549 m)   Wt 106 lb 6.4 oz (48.3 kg)   SpO2 98%   BMI 20.10 kg/m   BP Readings from Last 3 Encounters:  07/07/22 (!) 160/100  01/07/21 116/60  12/24/20 (!) 142/94    Wt Readings from Last 3 Encounters:  07/07/22 106 lb 6.4 oz (48.3 kg)  01/07/21 102 lb (46.3 kg)  12/24/20 105 lb (47.6 kg)    Physical Exam Vitals reviewed.  Constitutional:      Appearance: She is not ill-appearing.  HENT:     Nose: Nose normal.     Mouth/Throat:     Mouth: Mucous membranes are moist.  Eyes:     General: No scleral icterus.    Conjunctiva/sclera: Conjunctivae normal.  Cardiovascular:     Rate and Rhythm: Regular rhythm. Bradycardia present.     Heart sounds: Normal heart sounds, S1 normal and S2 normal. No murmur heard.    Comments: EKG -  SB, 58 bpm RAE No LVH, ST/T wave changes, or Q waves Pulmonary:     Effort: Pulmonary effort is normal.     Breath sounds: No stridor. No wheezing, rhonchi or rales.  Abdominal:     General: Abdomen is flat. There is no distension.     Palpations: There is no mass.     Tenderness: There is no abdominal tenderness. There is no guarding.     Hernia: No hernia is present.  Musculoskeletal:  General: Normal range of motion.     Cervical back: Neck supple.     Right lower leg: No edema.     Left lower leg: No edema.  Lymphadenopathy:     Cervical: No cervical adenopathy.  Skin:    General: Skin is warm and dry.     Coloration: Skin is not pale.  Neurological:     General: No focal deficit present.     Mental Status: She is alert. Mental status is at baseline.  Psychiatric:        Mood and Affect: Mood normal.        Behavior: Behavior normal.     Lab Results  Component Value Date   WBC 6.7 11/22/2020   HGB 13.1 11/22/2020   HCT 38.6 11/22/2020   PLT 200 11/22/2020   GLUCOSE 84 07/07/2022   CHOL 187 07/07/2022   TRIG 44.0 07/07/2022   HDL 82.90  07/07/2022   LDLCALC 95 07/07/2022   ALT 26 07/07/2022   AST 33 07/07/2022   NA 138 07/07/2022   K 5.2 (H) 07/07/2022   CL 101 07/07/2022   CREATININE 0.82 07/07/2022   BUN 14 07/07/2022   CO2 28 07/07/2022   TSH 2.61 07/07/2022   HGBA1C 5.1 11/20/2020    MM 3D SCREEN BREAST BILATERAL  Result Date: 02/17/2021 CLINICAL DATA:  Screening. EXAM: DIGITAL SCREENING BILATERAL MAMMOGRAM WITH TOMOSYNTHESIS AND CAD TECHNIQUE: Bilateral screening digital craniocaudal and mediolateral oblique mammograms were obtained. Bilateral screening digital breast tomosynthesis was performed. The images were evaluated with computer-aided detection. COMPARISON:  None. ACR Breast Density Category c: The breast tissue is heterogeneously dense, which may obscure small masses FINDINGS: There are no findings suspicious for malignancy. The images were evaluated with computer-aided detection. IMPRESSION: No mammographic evidence of malignancy. A result letter of this screening mammogram will be mailed directly to the patient. RECOMMENDATION: Screening mammogram in one year. (Code:SM-B-01Y) BI-RADS CATEGORY  1: Negative. Electronically Signed   By: Annia Belt M.D.   On: 02/17/2021 08:36  Apply a compressive ACE bandage. Rest and elevate the affected painful area.  Apply cold compresses intermittently as needed.  As pain recedes, begin normal activities slowly as tolerated.  Call if symptoms persist.   No results found.   Assessment & Plan:   Cheryl Hanna was seen today for annual exam and constipation.  Diagnoses and all orders for this visit:  Need for shingles vaccine -     Varicella-zoster vaccine IM  Encounter for general adult medical examination with abnormal findings- Exam completed, labs reviewed- statin is not indicated, vaccines are up-to-date, cancer screenings addressed, patient education was given. -     Lipid panel; Future -     Lipid panel  Chronic idiopathic constipation- Labs are negative for secondary  causes.  Will treat with lubiprostone. -     Basic metabolic panel; Future -     Hepatic function panel; Future -     TSH; Future -     Magnesium; Future -     DG ABD ACUTE 2+V W 1V CHEST; Future -     Magnesium -     TSH -     Hepatic function panel -     Basic metabolic panel -     lubiprostone (AMITIZA) 24 MCG capsule; Take 1 capsule (24 mcg total) by mouth 2 (two) times daily with a meal.  Primary hypertension- Her blood pressure is not well controlled.  Will restart the ARB/thiazide diuretic.  Will work-up  for secondary causes and endorgan damage. -     Basic metabolic panel; Future -     Aldosterone + renin activity w/ ratio; Future -     Hepatic function panel; Future -     TSH; Future -     Urinalysis, Routine w reflex microscopic; Future -     Urinalysis, Routine w reflex microscopic -     TSH -     Hepatic function panel -     Aldosterone + renin activity w/ ratio -     Basic metabolic panel -     Azilsartan-Chlorthalidone (EDARBYCLOR) 40-12.5 MG TABS; Take 1 tablet by mouth daily. 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  Hyperkalemia -     US Renal Artery Stenosis; Future   I am having Cheryl Hanna start on lubiprostone. I am also having her maintain her Edarbyclor.  Meds ordered this encounter  Medications   lubiprostone (AMITIZA) 24 MCG capsule    Sig: Take 1 capsule (24 mcg total) by mouth 2 (two) times daily with a meal.    Dispense:  180 capsule    Refill:  1   Azilsartan-Chlorthalidone (EDARBYCLOR) 40-12.5 MG TABS    Sig: Take 1 tablet by mouth daily.    Dispense:  90 tablet    Refill:  0     Follow-up: Return in about 3 months (around 10/07/2022).  Sanda Linger, MD

## 2022-07-07 NOTE — Patient Instructions (Signed)

## 2022-07-08 ENCOUNTER — Telehealth: Payer: Self-pay

## 2022-07-08 NOTE — Telephone Encounter (Signed)
Key: [id number]

## 2022-07-08 NOTE — Telephone Encounter (Signed)
approved through 07/09/2023

## 2022-07-13 LAB — ALDOSTERONE + RENIN ACTIVITY W/ RATIO
ALDO / PRA Ratio: 5.4 Ratio (ref 0.9–28.9)
Aldosterone: 2 ng/dL
Renin Activity: 0.37 ng/mL/h (ref 0.25–5.82)

## 2022-07-17 ENCOUNTER — Ambulatory Visit
Admission: RE | Admit: 2022-07-17 | Discharge: 2022-07-17 | Disposition: A | Discharge status: Home / Self Care | Payer: 59 | Source: Ambulatory Visit | Attending: Internal Medicine | Admitting: Internal Medicine

## 2022-07-17 DIAGNOSIS — E875 Hyperkalemia: Secondary | ICD-10-CM

## 2022-07-17 DIAGNOSIS — I10 Essential (primary) hypertension: Secondary | ICD-10-CM

## 2022-07-17 NOTE — Addendum Note (Signed)
Addended by: Darryll Capers on: 07/17/2022 09:19 AM   Modules accepted: Orders

## 2022-07-25 ENCOUNTER — Other Ambulatory Visit: Payer: Self-pay | Admitting: Internal Medicine

## 2022-07-25 DIAGNOSIS — I701 Atherosclerosis of renal artery: Secondary | ICD-10-CM

## 2022-09-16 ENCOUNTER — Ambulatory Visit
Admission: RE | Admit: 2022-09-16 | Discharge: 2022-09-16 | Disposition: A | Discharge status: Home / Self Care | Payer: 59 | Source: Ambulatory Visit | Attending: Internal Medicine | Admitting: Internal Medicine

## 2022-09-16 DIAGNOSIS — Z1231 Encounter for screening mammogram for malignant neoplasm of breast: Secondary | ICD-10-CM

## 2022-12-03 ENCOUNTER — Other Ambulatory Visit: Payer: Self-pay | Admitting: Internal Medicine

## 2022-12-03 ENCOUNTER — Encounter: Payer: Self-pay | Admitting: Internal Medicine

## 2022-12-03 DIAGNOSIS — I10 Essential (primary) hypertension: Secondary | ICD-10-CM

## 2022-12-03 MED ORDER — INDAPAMIDE 1.25 MG PO TABS: 1.2500 mg | ORAL_TABLET | Freq: Every day | ORAL | 0 refills | Status: DC

## 2022-12-03 MED ORDER — OLMESARTAN MEDOXOMIL 20 MG PO TABS: 20.0000 mg | ORAL_TABLET | Freq: Every day | ORAL | 0 refills | Status: DC

## 2023-04-05 ENCOUNTER — Other Ambulatory Visit: Payer: Self-pay | Admitting: Internal Medicine

## 2023-04-05 DIAGNOSIS — K5904 Chronic idiopathic constipation: Secondary | ICD-10-CM

## 2023-04-06 ENCOUNTER — Encounter: Payer: Self-pay | Admitting: Internal Medicine

## 2023-04-26 ENCOUNTER — Other Ambulatory Visit: Payer: Self-pay | Admitting: Internal Medicine

## 2023-04-26 DIAGNOSIS — K5904 Chronic idiopathic constipation: Secondary | ICD-10-CM

## 2023-07-01 ENCOUNTER — Encounter (INDEPENDENT_AMBULATORY_CARE_PROVIDER_SITE_OTHER): Payer: Self-pay

## 2023-07-13 ENCOUNTER — Encounter: Payer: 59 | Admitting: Internal Medicine

## 2023-08-09 ENCOUNTER — Encounter: Payer: Self-pay | Admitting: Internal Medicine

## 2023-08-09 ENCOUNTER — Ambulatory Visit: Payer: 59 | Admitting: Internal Medicine

## 2023-08-09 VITALS — BP 130/86 | HR 71 | Temp 97.9°F | Ht 61.0 in | Wt 103.0 lb

## 2023-08-09 DIAGNOSIS — I119 Hypertensive heart disease without heart failure: Secondary | ICD-10-CM

## 2023-08-09 DIAGNOSIS — Z0000 Encounter for general adult medical examination without abnormal findings: Secondary | ICD-10-CM

## 2023-08-09 DIAGNOSIS — K5904 Chronic idiopathic constipation: Secondary | ICD-10-CM | POA: Diagnosis not present

## 2023-08-09 DIAGNOSIS — I10 Essential (primary) hypertension: Secondary | ICD-10-CM

## 2023-08-09 DIAGNOSIS — Z0001 Encounter for general adult medical examination with abnormal findings: Secondary | ICD-10-CM

## 2023-08-09 DIAGNOSIS — I701 Atherosclerosis of renal artery: Secondary | ICD-10-CM

## 2023-08-09 DIAGNOSIS — Z23 Encounter for immunization: Secondary | ICD-10-CM

## 2023-08-09 LAB — URINALYSIS, ROUTINE W REFLEX MICROSCOPIC
Bilirubin Urine: NEGATIVE
Hgb urine dipstick: NEGATIVE
Ketones, ur: NEGATIVE
Leukocytes,Ua: NEGATIVE
Nitrite: NEGATIVE
RBC / HPF: NONE SEEN (ref 0–?)
Specific Gravity, Urine: 1.01 (ref 1.000–1.030)
Total Protein, Urine: NEGATIVE
Urine Glucose: NEGATIVE
Urobilinogen, UA: 0.2 (ref 0.0–1.0)
WBC, UA: NONE SEEN (ref 0–?)
pH: 7.5 (ref 5.0–8.0)

## 2023-08-09 LAB — HEPATIC FUNCTION PANEL
ALT: 29 U/L (ref 0–35)
AST: 34 U/L (ref 0–37)
Albumin: 4.5 g/dL (ref 3.5–5.2)
Alkaline Phosphatase: 65 U/L (ref 39–117)
Bilirubin, Direct: 0.1 mg/dL (ref 0.0–0.3)
Total Bilirubin: 0.6 mg/dL (ref 0.2–1.2)
Total Protein: 7.4 g/dL (ref 6.0–8.3)

## 2023-08-09 LAB — CBC WITH DIFFERENTIAL/PLATELET
Basophils Absolute: 0 10*3/uL (ref 0.0–0.1)
Basophils Relative: 1 % (ref 0.0–3.0)
Eosinophils Absolute: 0.1 10*3/uL (ref 0.0–0.7)
Eosinophils Relative: 3.2 % (ref 0.0–5.0)
HCT: 42 % (ref 36.0–46.0)
Hemoglobin: 13.9 g/dL (ref 12.0–15.0)
Lymphocytes Relative: 33.1 % (ref 12.0–46.0)
Lymphs Abs: 1.5 10*3/uL (ref 0.7–4.0)
MCHC: 33.1 g/dL (ref 30.0–36.0)
MCV: 94.1 fl (ref 78.0–100.0)
Monocytes Absolute: 0.4 10*3/uL (ref 0.1–1.0)
Monocytes Relative: 8.8 % (ref 3.0–12.0)
Neutro Abs: 2.4 10*3/uL (ref 1.4–7.7)
Neutrophils Relative %: 53.9 % (ref 43.0–77.0)
Platelets: 214 10*3/uL (ref 150.0–400.0)
RBC: 4.46 Mil/uL (ref 3.87–5.11)
RDW: 13.4 % (ref 11.5–15.5)
WBC: 4.4 10*3/uL (ref 4.0–10.5)

## 2023-08-09 LAB — BASIC METABOLIC PANEL
BUN: 11 mg/dL (ref 6–23)
CO2: 29 mEq/L (ref 19–32)
Calcium: 10 mg/dL (ref 8.4–10.5)
Chloride: 103 mEq/L (ref 96–112)
Creatinine, Ser: 0.8 mg/dL (ref 0.40–1.20)
GFR: 82.65 mL/min (ref >60.00)
Glucose, Bld: 94 mg/dL (ref 70–99)
Potassium: 4 mEq/L (ref 3.5–5.1)
Sodium: 141 mEq/L (ref 135–145)

## 2023-08-09 LAB — MAGNESIUM: Magnesium: 2.2 mg/dL (ref 1.5–2.5)

## 2023-08-09 LAB — TSH: TSH: 2.5 u[IU]/mL (ref 0.35–5.50)

## 2023-08-09 MED ORDER — INDAPAMIDE 1.25 MG PO TABS: 1.2500 mg | ORAL_TABLET | Freq: Every day | ORAL | 1 refills | Status: DC

## 2023-08-09 MED ORDER — LUBIPROSTONE 24 MCG PO CAPS
24.0000 ug | ORAL_CAPSULE | Freq: Two times a day (BID) | ORAL | 1 refills | Status: DC
Start: 2023-08-09 — End: 2023-08-12

## 2023-08-09 MED ORDER — INDAPAMIDE 1.25 MG PO TABS
1.2500 mg | ORAL_TABLET | Freq: Every day | ORAL | 1 refills | Status: DC
Start: 2023-08-09 — End: 2024-09-04

## 2023-08-09 MED ORDER — OLMESARTAN MEDOXOMIL 20 MG PO TABS
20.0000 mg | ORAL_TABLET | Freq: Every day | ORAL | 1 refills | Status: DC
Start: 2023-08-09 — End: 2023-11-27

## 2023-08-09 NOTE — Progress Notes (Unsigned)
Subjective:  Patient ID: Cheryl Hanna, female    DOB: 27-Dec-1965  Age: 57 y.o. MRN: [id number]  CC: Annual Exam and Hypertension   HPI Cheryl Hanna presents for a CPX and f/up -  Discussed the use of AI scribe software for clinical note transcription with the patient, who gave verbal consent to proceed.  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 They report that the constipation was well controlled over the summer but has worsened since returning to work. They attribute this to the lack of opportunity to use the restroom during the school day. They also report occasional dizziness, but deny any headache, blurred vision, chest pain, or shortness of breath. They have adjusted the timing of their antihypertensive medication to mitigate the side effect of morning fatigue. They maintain an active lifestyle, running ten miles daily with their [place]. Despite this, they express frustration at their hypertension diagnosis. They also report a history of a thickened heart muscle.       Outpatient Medications Prior to Visit  Medication Sig Dispense Refill   indapamide (LOZOL) 1.25 MG tablet Take 1 tablet (1.25 mg total) by mouth daily. 90 tablet 0   lubiprostone (AMITIZA) 24 MCG capsule Take 1 capsule (24 mcg total) by mouth 2 (two) times daily with a meal. 180 capsule 1   olmesartan (BENICAR) 20 MG tablet Take 1 tablet (20 mg total) by mouth daily. 90 tablet 0   No facility-administered medications prior to visit.    ROS Review of Systems  Gastrointestinal:  Positive for constipation.    Objective:  BP 130/86 (BP Location: Right Arm, Patient Position: Sitting, Cuff Size: Normal)   Pulse 71   Temp 97.9 F (36.6 C) (Oral)   Ht 5\' 1"  (1.549 m)   Wt 103 lb (46.7 kg)   SpO2 98%   BMI 19.46 kg/m   BP Readings from Last 3 Encounters:  08/09/23 130/86  07/07/22 (!) 160/100  01/07/21 116/60     Wt Readings from Last 3 Encounters:  08/09/23 103 lb (46.7 kg)  07/07/22 106 lb 6.4 oz (48.3 kg)  01/07/21 102 lb (46.3 kg)    Physical Exam Cardiovascular:     Rate and Rhythm: Normal rate and regular rhythm.     Heart sounds: Normal heart sounds, S1 normal and S2 normal. No murmur heard.    Comments: EKG- NSR, 73 bpm BAE No LVH, Q waves, or ST/T waves  Unchanged Musculoskeletal:     Right lower leg: No edema.     Left lower leg: No edema.     Lab Results  Component Value Date   WBC 4.4 08/09/2023   HGB 13.9 08/09/2023   HCT 42.0 08/09/2023   PLT 214.0 08/09/2023   GLUCOSE 94 08/09/2023   CHOL 187 07/07/2022   TRIG 44.0 07/07/2022   HDL 82.90 07/07/2022   LDLCALC 95 07/07/2022   ALT 29 08/09/2023   AST 34 08/09/2023   NA 141 08/09/2023   K 4.0 08/09/2023   CL 103 08/09/2023   CREATININE 0.80 08/09/2023   BUN 11 08/09/2023   CO2 29 08/09/2023   TSH 2.50 08/09/2023   HGBA1C 5.1 11/20/2020    MM 3D SCREEN BREAST BILATERAL  Result Date: 09/17/2022 CLINICAL DATA:  Screening. EXAM: DIGITAL SCREENING BILATERAL MAMMOGRAM WITH TOMOSYNTHESIS AND CAD TECHNIQUE: Bilateral screening digital craniocaudal and mediolateral oblique mammograms were obtained. Bilateral screening digital breast tomosynthesis  was performed. The images were evaluated with computer-aided detection. COMPARISON:  Previous exam(s). ACR Breast Density Category b: There are scattered areas of fibroglandular density. FINDINGS: There are no findings suspicious for malignancy. IMPRESSION: No mammographic evidence of malignancy. A result letter of this screening mammogram will be mailed directly to the patient. RECOMMENDATION: Screening mammogram in one year. (Code:SM-B-01Y) BI-RADS CATEGORY  1: Negative. Electronically Signed   By: Gerome Sam III M.D.   On: 09/17/2022 17:45    Assessment & Plan:  Flu vaccine need -     Flu vaccine trivalent PF, 6mos and  older(Flulaval,Afluria,Fluarix,Fluzone)  Stenosis of right renal artery (HCC) -     Basic metabolic panel; Future -     CBC with Differential/Platelet; Future  Encounter for general adult medical examination with abnormal findings  LVH (left ventricular hypertrophy) due to hypertensive disease, without heart failure -     Basic metabolic panel; Future -     CBC with Differential/Platelet; Future  Primary hypertension -     Hepatic function panel; Future -     TSH; Future -     Urinalysis, Routine w reflex microscopic; Future -     EKG 12-Lead -     Indapamide; Take 1 tablet (1.25 mg total) by mouth daily.  Dispense: 90 tablet; Refill: 1 -     Olmesartan Medoxomil; Take 1 tablet (20 mg total) by mouth daily.  Dispense: 90 tablet; Refill: 1  Chronic idiopathic constipation -     Magnesium; Future -     Lubiprostone; Take 1 capsule (24 mcg total) by mouth 2 (two) times daily with a meal.  Dispense: 180 capsule; Refill: 1  Other orders -     Varicella-zoster vaccine IM     Follow-up: Return in about 6 months (around 02/06/2024).  Sanda Linger, MD

## 2023-08-09 NOTE — Patient Instructions (Signed)

## 2023-08-10 ENCOUNTER — Other Ambulatory Visit: Payer: Self-pay | Admitting: Internal Medicine

## 2023-08-12 ENCOUNTER — Encounter: Payer: Self-pay | Admitting: Internal Medicine

## 2023-08-12 ENCOUNTER — Other Ambulatory Visit: Payer: Self-pay | Admitting: Internal Medicine

## 2023-08-12 DIAGNOSIS — K5904 Chronic idiopathic constipation: Secondary | ICD-10-CM

## 2023-08-13 ENCOUNTER — Other Ambulatory Visit: Payer: Self-pay | Admitting: Internal Medicine

## 2023-08-13 ENCOUNTER — Telehealth: Payer: Self-pay

## 2023-08-13 DIAGNOSIS — K5904 Chronic idiopathic constipation: Secondary | ICD-10-CM

## 2023-08-13 NOTE — Progress Notes (Unsigned)
   Care Guide Note  08/13/2023 Name: Cheryl Hanna MRN: [id number] DOB: 11/20/1965  Referred by: Etta Grandchild, MD Reason for referral : Care Coordination (Outreach to schedule with Pharm d )   Cheryl Hanna is a 57 y.o. year old female who is a primary care patient of Etta Grandchild, MD. Cheryl Hanna was referred to the pharmacist for assistance related to  med assistance  .    An unsuccessful telephone outreach was attempted today to contact the patient who was referred to the pharmacy team for assistance with medication assistance. Additional attempts will be made to contact the patient.   Penne Lash, RMA Care Guide [facility]  [place], [place] [address] Direct Dial: [phone] [email or web address]

## 2023-08-18 ENCOUNTER — Telehealth: Payer: Self-pay | Admitting: Pharmacist

## 2023-08-18 ENCOUNTER — Other Ambulatory Visit (HOSPITAL_COMMUNITY): Payer: Self-pay

## 2023-08-18 NOTE — Telephone Encounter (Signed)
Pharmacy Patient Advocate Encounter   Received notification from CoverMyMeds that prior authorization for Lubiprostone capsules is required/requested.   Insurance verification completed.   The patient is insured through [facility] .   Per test claim: PA required; PA submitted to [facility] via CoverMyMeds Key/confirmation #/EOC [id number] Status is pending

## 2023-08-19 ENCOUNTER — Encounter: Payer: Self-pay | Admitting: Internal Medicine

## 2023-08-19 ENCOUNTER — Other Ambulatory Visit (HOSPITAL_COMMUNITY): Payer: Self-pay

## 2023-08-19 DIAGNOSIS — K5904 Chronic idiopathic constipation: Secondary | ICD-10-CM

## 2023-08-19 MED ORDER — LUBIPROSTONE 24 MCG PO CAPS: 24.0000 ug | ORAL_CAPSULE | Freq: Two times a day (BID) | ORAL | 1 refills | Status: DC

## 2023-08-19 NOTE — Progress Notes (Signed)
   Care Guide Note  08/19/2023 Name: Cheryl Hanna MRN: [id number] DOB: [birth year]  Referred by: Etta Grandchild, MD Reason for referral : Care Coordination (Outreach to schedule with Pharm d )   Cheryl Hanna is a 57 y.o. year old female who is a primary care patient of Etta Grandchild, MD. Cheryl Hanna was referred to the pharmacist for assistance related to  med assistance  .    Successful contact was made with the patient to discuss pharmacy services. Patient declines engagement at this time. Contact information was provided to the patient should they wish to reach out for assistance at a later time.  Cheryl Hanna, RMA Care Guide [facility]  [place], [place] [address] Direct Dial: [phone] [email or web address]

## 2023-08-19 NOTE — Telephone Encounter (Signed)
Pharmacy Patient Advocate Encounter  Received notification from [facility] that Prior Authorization for Lubiprostone capsules has been APPROVED from 08/18/2023 to 08/17/2024   PA #/Case ID/Reference #: [id number]

## 2023-08-19 NOTE — Telephone Encounter (Signed)
Patient called back and this rx needs to be sent to [facility] on [address]

## 2023-08-20 ENCOUNTER — Other Ambulatory Visit (HOSPITAL_COMMUNITY): Payer: Self-pay

## 2023-11-04 ENCOUNTER — Telehealth: Payer: Self-pay | Admitting: Pharmacy Technician

## 2023-11-04 ENCOUNTER — Other Ambulatory Visit (HOSPITAL_COMMUNITY): Payer: Self-pay

## 2023-11-04 NOTE — Telephone Encounter (Signed)
Pharmacy Patient Advocate Encounter   Received notification from CoverMyMeds that prior authorization for Lubiprostone is required/requested.   Insurance verification completed.   The patient is insured through [facility] .   Per test claim: The current 11/04/23 day co-pay is, $45.00- ONE month only.  No PA needed at this time. This test claim was processed through [facility]- copay amounts may vary at other pharmacies due to pharmacy/plan contracts, or as the patient moves through the different stages of their insurance plan.

## 2023-11-27 ENCOUNTER — Other Ambulatory Visit: Payer: Self-pay | Admitting: Internal Medicine

## 2023-11-27 DIAGNOSIS — I10 Essential (primary) hypertension: Secondary | ICD-10-CM

## 2024-01-26 ENCOUNTER — Other Ambulatory Visit: Payer: Self-pay | Admitting: Internal Medicine

## 2024-01-26 DIAGNOSIS — K5904 Chronic idiopathic constipation: Secondary | ICD-10-CM

## 2024-02-03 ENCOUNTER — Telehealth: Payer: Self-pay

## 2024-02-03 ENCOUNTER — Other Ambulatory Visit (HOSPITAL_COMMUNITY): Payer: Self-pay

## 2024-02-03 NOTE — Telephone Encounter (Signed)
 Patient has been made aware.

## 2024-02-03 NOTE — Telephone Encounter (Signed)
 Pharmacy Patient Advocate Encounter  Received notification from [facility] that Prior Authorization for Lubiprostone capsules has been APPROVED from 02/03/24 to 02/02/25. Ran test claim, Copay is $53.40. This test claim was processed through [facility]- copay amounts may vary at other pharmacies due to pharmacy/plan contracts, or as the patient moves through the different stages of their insurance plan.   PA #/Case ID/Reference #: [id number]

## 2024-02-03 NOTE — Telephone Encounter (Signed)
 Pharmacy Patient Advocate Encounter   Received notification from CoverMyMeds that prior authorization for Lubiprostone capsules is required/requested.   Insurance verification completed.   The patient is insured through [facility] .   Per test claim: PA required; PA submitted to above mentioned insurance via CoverMyMeds Key/confirmation #/EOC [id number] Status is pending

## 2024-03-08 ENCOUNTER — Encounter (HOSPITAL_COMMUNITY): Payer: Self-pay

## 2024-03-08 ENCOUNTER — Emergency Department (HOSPITAL_COMMUNITY)

## 2024-03-08 ENCOUNTER — Inpatient Hospital Stay (HOSPITAL_COMMUNITY)
Admission: EM | Admit: 2024-03-08 | Discharge: 2024-03-09 | DRG: 390 | Disposition: A | Discharge status: Home / Self Care | Attending: Family Medicine | Admitting: Family Medicine

## 2024-03-08 ENCOUNTER — Other Ambulatory Visit: Payer: Self-pay

## 2024-03-08 ENCOUNTER — Inpatient Hospital Stay (HOSPITAL_COMMUNITY)

## 2024-03-08 DIAGNOSIS — Z841 Family history of disorders of kidney and ureter: Secondary | ICD-10-CM

## 2024-03-08 DIAGNOSIS — Z79899 Other long term (current) drug therapy: Secondary | ICD-10-CM | POA: Diagnosis not present

## 2024-03-08 DIAGNOSIS — K5904 Chronic idiopathic constipation: Secondary | ICD-10-CM | POA: Diagnosis present

## 2024-03-08 DIAGNOSIS — K565 Intestinal adhesions [bands], unspecified as to partial versus complete obstruction: Secondary | ICD-10-CM | POA: Diagnosis present

## 2024-03-08 DIAGNOSIS — K56609 Unspecified intestinal obstruction, unspecified as to partial versus complete obstruction: Principal | ICD-10-CM

## 2024-03-08 DIAGNOSIS — I10 Essential (primary) hypertension: Secondary | ICD-10-CM | POA: Diagnosis present

## 2024-03-08 DIAGNOSIS — Z8249 Family history of ischemic heart disease and other diseases of the circulatory system: Secondary | ICD-10-CM

## 2024-03-08 LAB — CBC WITH DIFFERENTIAL/PLATELET
Abs Immature Granulocytes: 0.02 10*3/uL (ref 0.00–0.07)
Basophils Absolute: 0.1 10*3/uL (ref 0.0–0.1)
Basophils Relative: 1 %
Eosinophils Absolute: 0.5 10*3/uL (ref 0.0–0.5)
Eosinophils Relative: 8 %
HCT: 44 % (ref 36.0–46.0)
Hemoglobin: 14.7 g/dL (ref 12.0–15.0)
Immature Granulocytes: 0 %
Lymphocytes Relative: 42 %
Lymphs Abs: 2.4 10*3/uL (ref 0.7–4.0)
MCH: 30.9 pg (ref 26.0–34.0)
MCHC: 33.4 g/dL (ref 30.0–36.0)
MCV: 92.4 fL (ref 80.0–100.0)
Monocytes Absolute: 0.6 10*3/uL (ref 0.1–1.0)
Monocytes Relative: 10 %
Neutro Abs: 2.2 10*3/uL (ref 1.7–7.7)
Neutrophils Relative %: 39 %
Platelets: 209 10*3/uL (ref 150–400)
RBC: 4.76 MIL/uL (ref 3.87–5.11)
RDW: 13.2 % (ref 11.5–15.5)
WBC: 5.6 10*3/uL (ref 4.0–10.5)
nRBC: 0 % (ref 0.0–0.2)

## 2024-03-08 LAB — COMPREHENSIVE METABOLIC PANEL WITH GFR
ALT: 41 U/L (ref 0–44)
AST: 29 U/L (ref 15–41)
Albumin: 4.5 g/dL (ref 3.5–5.0)
Alkaline Phosphatase: 72 U/L (ref 38–126)
Anion gap: 8 (ref 5–15)
BUN: 23 mg/dL — ABNORMAL HIGH (ref 6–20)
CO2: 26 mmol/L (ref 22–32)
Calcium: 10 mg/dL (ref 8.9–10.3)
Chloride: 103 mmol/L (ref 98–111)
Creatinine, Ser: 0.61 mg/dL (ref 0.44–1.00)
GFR, Estimated: >60 mL/min (ref >60)
Glucose, Bld: 112 mg/dL — ABNORMAL HIGH (ref 70–99)
Potassium: 4.4 mmol/L (ref 3.5–5.1)
Sodium: 137 mmol/L (ref 135–145)
Total Bilirubin: 0.7 mg/dL (ref 0.0–1.2)
Total Protein: 7.7 g/dL (ref 6.5–8.1)

## 2024-03-08 LAB — GLUCOSE, CAPILLARY: Glucose-Capillary: 81 mg/dL (ref 70–99)

## 2024-03-08 LAB — LIPASE, BLOOD: Lipase: 35 U/L (ref 11–51)

## 2024-03-08 LAB — HCG, SERUM, QUALITATIVE: Preg, Serum: NEGATIVE

## 2024-03-08 MED ORDER — SODIUM CHLORIDE 0.9% FLUSH
3.0000 mL | Freq: Two times a day (BID) | INTRAVENOUS | Status: DC
  Administered 2024-03-08 (×2): 3 mL via INTRAVENOUS

## 2024-03-08 MED ORDER — MORPHINE SULFATE (PF) 2 MG/ML IV SOLN: 2.0000 mg | INTRAVENOUS | Status: DC | PRN

## 2024-03-08 MED ORDER — HYDROMORPHONE HCL 1 MG/ML IJ SOLN
1.0000 mg | INTRAMUSCULAR | Status: DC | PRN
  Administered 2024-03-08 (×2): 1 mg via INTRAVENOUS
  Filled 2024-03-08 (×2): qty 1

## 2024-03-08 MED ORDER — ONDANSETRON HCL 4 MG PO TABS: 4.0000 mg | ORAL_TABLET | Freq: Four times a day (QID) | ORAL | Status: DC | PRN

## 2024-03-08 MED ORDER — ACETAMINOPHEN 650 MG RE SUPP: 650.0000 mg | Freq: Four times a day (QID) | RECTAL | Status: DC | PRN

## 2024-03-08 MED ORDER — HYDROMORPHONE HCL 1 MG/ML IJ SOLN: 1.0000 mg | Freq: Once | INTRAMUSCULAR | Status: DC

## 2024-03-08 MED ORDER — ACETAMINOPHEN 325 MG PO TABS
650.0000 mg | ORAL_TABLET | Freq: Four times a day (QID) | ORAL | Status: DC | PRN
  Administered 2024-03-09: 650 mg
  Filled 2024-03-08: qty 2

## 2024-03-08 MED ORDER — ONDANSETRON HCL 4 MG/2ML IJ SOLN
4.0000 mg | Freq: Four times a day (QID) | INTRAMUSCULAR | Status: DC | PRN
Start: 2024-03-08 — End: 2024-03-09

## 2024-03-08 MED ORDER — LACTATED RINGERS IV SOLN: INTRAVENOUS | Status: AC

## 2024-03-08 MED ORDER — SODIUM CHLORIDE (PF) 0.9 % IJ SOLN
INTRAMUSCULAR | Status: AC
  Filled 2024-03-08: qty 50

## 2024-03-08 MED ORDER — SODIUM CHLORIDE 0.9 % IV BOLUS
1000.0000 mL | Freq: Once | INTRAVENOUS | Status: AC
  Administered 2024-03-08: 1000 mL via INTRAVENOUS

## 2024-03-08 MED ORDER — DIATRIZOATE MEGLUMINE & SODIUM 66-10 % PO SOLN
90.0000 mL | Freq: Once | ORAL | Status: AC
  Administered 2024-03-08: 90 mL via NASOGASTRIC
  Filled 2024-03-08: qty 90

## 2024-03-08 MED ORDER — IOHEXOL 300 MG/ML  SOLN
75.0000 mL | Freq: Once | INTRAMUSCULAR | Status: AC | PRN
  Administered 2024-03-08: 75 mL via INTRAVENOUS

## 2024-03-08 NOTE — ED Notes (Signed)
 ED TO INPATIENT HANDOFF REPORT  ED Nurse Name and Phone #: Sherley Distad EMTP   S Name/Age/Gender Cheryl Hanna 58 y.o. female Room/Bed: WA02/WA02  Code Status   Code Status: Prior  Home/SNF/Other Home Patient oriented to: self, place, time, and situation Is this baseline? Yes   Triage Complete: Triage complete  Chief Complaint SBO (small bowel obstruction) (HCC) [K56.609]  Triage Note Patient c/o abdominal pain and nausea that started last night. Patient crying in triage due to pain in lower abdomen. Patient stated she has a hx Bowel obstruction and it feels the same she felt then.    Allergies No Known Allergies  Level of Care/Admitting Diagnosis ED Disposition     ED Disposition  Admit   Condition  --   Comment  Hospital Area: [facility] [[id number]]  Level of Care: Med-Surg [16]  May admit patient to [facility] or [facility] Smalling if equivalent level of care is available:: Yes  Covid Evaluation: Asymptomatic - no recent exposure (last 10 days) testing not required  Diagnosis: SBO (small bowel obstruction) [facility]) [[id number]]  Admitting Physician: Bary Boss [[id number]]  Attending Physician: Bary Boss [[id number]]  Certification:: I certify this patient will need inpatient services for at least 2 midnights  Expected Medical Readiness: 03/10/2024          B Medical/Surgery History Past Medical History:  Diagnosis Date   Hypertension    Past Surgical History:  Procedure Laterality Date   CESAREAN SECTION  1993   DILATION AND CURETTAGE OF UTERUS     TUBAL LIGATION       A IV Location/Drains/Wounds Patient Lines/Drains/Airways Status     Active Line/Drains/Airways     Name Placement date Placement time Site Days   Peripheral IV 03/08/24 20 G 1" Anterior;Distal;Left;Upper Arm 03/08/24  0148  Arm  less than 1   NG/OG Vented/Dual Lumen 16 Fr. Right nare Marking at nare/corner of mouth 55 cm 03/08/24  0518  Right nare  less than 1             Intake/Output Last 24 hours No intake or output data in the 24 hours ending 03/08/24 0533  Labs/Imaging Results for orders placed or performed during the hospital encounter of 03/08/24 (from the past 48 hours)  Comprehensive metabolic panel     Status: Abnormal   Collection Time: 03/08/24  1:38 AM  Result Value Ref Range   Sodium 137 135 - 145 mmol/L   Potassium 4.4 3.5 - 5.1 mmol/L   Chloride 103 98 - 111 mmol/L   CO2 26 22 - 32 mmol/L   Glucose, Bld 112 (H) 70 - 99 mg/dL    Comment: Glucose reference range applies only to samples taken after fasting for at least 8 hours.   BUN 23 (H) 6 - 20 mg/dL   Creatinine, Ser 3.24 0.44 - 1.00 mg/dL   Calcium 40.1 8.9 - 02.7 mg/dL   Total Protein 7.7 6.5 - 8.1 g/dL   Albumin 4.5 3.5 - 5.0 g/dL   AST 29 15 - 41 U/L   ALT 41 0 - 44 U/L   Alkaline Phosphatase 72 38 - 126 U/L   Total Bilirubin 0.7 0.0 - 1.2 mg/dL   GFR, Estimated >25 >36 mL/min    Comment: (NOTE) Calculated using the CKD-EPI Creatinine Equation (2021)    Anion gap 8 5 - 15    Comment: Performed at [facility], [address] [place], [place] [address]  Lipase, blood     Status: None   Collection Time: 03/08/24  1:38 AM  Result Value Ref Range   Lipase 35 11 - 51 U/L    Comment: Performed at [facility], [address], [place], [place] [address]  CBC with Diff     Status: None   Collection Time: 03/08/24  1:38 AM  Result Value Ref Range   WBC 5.6 4.0 - 10.5 K/uL   RBC 4.76 3.87 - 5.11 MIL/uL   Hemoglobin 14.7 12.0 - 15.0 g/dL   HCT 01.0 27.2 - 53.6 %   MCV 92.4 80.0 - 100.0 fL   MCH 30.9 26.0 - 34.0 pg   MCHC 33.4 30.0 - 36.0 g/dL   RDW 64.4 03.4 - 74.2 %   Platelets 209 150 - 400 K/uL   nRBC 0.0 0.0 - 0.2 %   Neutrophils Relative % 39 %   Neutro Abs 2.2 1.7 - 7.7 K/uL   Lymphocytes Relative 42 %   Lymphs Abs 2.4 0.7 - 4.0 K/uL   Monocytes Relative 10 %   Monocytes Absolute 0.6 0.1 - 1.0 K/uL   Eosinophils  Relative 8 %   Eosinophils Absolute 0.5 0.0 - 0.5 K/uL   Basophils Relative 1 %   Basophils Absolute 0.1 0.0 - 0.1 K/uL   Immature Granulocytes 0 %   Abs Immature Granulocytes 0.02 0.00 - 0.07 K/uL    Comment: Performed at [facility], [address] [place], [place] [address]  hCG, serum, qualitative     Status: None   Collection Time: 03/08/24  1:38 AM  Result Value Ref Range   Preg, Serum NEGATIVE NEGATIVE    Comment:        THE SENSITIVITY OF THIS METHODOLOGY IS >10 mIU/mL. Performed at [facility], [address], [place], [place] [address]    CT ABDOMEN PELVIS W CONTRAST Result Date: 03/08/2024 CLINICAL DATA:  Evaluate for bowel obstruction. EXAM: CT ABDOMEN AND PELVIS WITH CONTRAST TECHNIQUE: Multidetector CT imaging of the abdomen and pelvis was performed using the standard protocol following bolus administration of intravenous contrast. RADIATION DOSE REDUCTION: This exam was performed according to the departmental dose-optimization program which includes automated exposure control, adjustment of the mA and/or kV according to patient size and/or use of iterative reconstruction technique. CONTRAST:  75mL OMNIPAQUE  IOHEXOL  300 MG/ML  SOLN COMPARISON:  11/21/2020 FINDINGS: Lower chest: No acute abnormality. Hepatobiliary: No focal liver abnormality is seen. No gallstones, gallbladder wall thickening, or biliary dilatation. Pancreas: Unremarkable. No pancreatic ductal dilatation or surrounding inflammatory changes. Spleen: Normal in size without focal abnormality. Adrenals/Urinary Tract: Normal adrenal glands. No nephrolithiasis, hydronephrosis or suspicious mass. Urinary bladder appears normal. Stomach/Bowel: Stomach appears normal. A distal small bowel obstruction is identified proximal to Leigh ileocecal valve with transition point in the right lower quadrant of the abdomen, image 45/7 in image 42/2. No underlying mass identified. Proximal to the  transition point there is fecalization of the dilated small bowel loops which measure up to 3 cm. There is edema/congestion within the adjacent small bowel mesentery. The appendix is visualized and appears normal. Colon has a normal caliber. Mild stool burden noted throughout the colon up to the level of the rectum. Vascular/Lymphatic: Normal caliber of the abdominal aorta. No signs of abdominopelvic adenopathy. Reproductive: Uterus and bilateral adnexa are unremarkable. Other: No free fluid or fluid collections. Musculoskeletal: No acute or significant osseous findings. IMPRESSION: 1. Imaging findings are compatible with distal small bowel  obstruction proximal to the ileocecal valve. 2. No signs of perforation. Electronically Signed   By: Kimberley Penman M.D.   On: 03/08/2024 04:34    Pending Labs Unresulted Labs (From admission, onward)     Start     Ordered   03/08/24 0134  Urinalysis, Routine w reflex microscopic -Urine, Clean Catch  Once,   URGENT       Question:  Specimen Source  Answer:  Urine, Clean Catch   03/08/24 0133            Vitals/Pain Today's Vitals   03/08/24 0300 03/08/24 0316 03/08/24 0430 03/08/24 0533  BP:   (!) 161/97   Pulse: 81  79   Resp: 12  13   Temp:      TempSrc:      SpO2: 94%  99%   PainSc:  7  6  5      Isolation Precautions No active isolations  Medications Medications  HYDROmorphone  (DILAUDID ) injection 1 mg (1 mg Intravenous Given 03/08/24 0430)  sodium chloride  0.9 % bolus 1,000 mL ( Intravenous Rate/Dose Verify 03/08/24 0351)  iohexol  (OMNIPAQUE ) 300 MG/ML solution 75 mL (75 mLs Intravenous Contrast Given 03/08/24 0330)    Mobility walks     Focused Assessments Patient is alert and oriented x4. Patient came in with abdominal pain. Patient has intermittent sharp pains in lower abdomen, but states the overall pain has eased some with medications. NG tube was placed and is awaiting confirmation via xray.    R Recommendations: See Admitting  Provider Note  Report given to:   Additional Notes:

## 2024-03-08 NOTE — Progress Notes (Signed)
 Pt arrived to unit via [facility] from ED. She is alert and oriented x4 and ambulated well from [facility] to bed. NGT present to R nare and clamped. Awaiting MD orders for further NGT intervention/maintenance. Pt instructed for UA collection and verbalizes understanding. Specimen cup provided.  Pt resting in bed comfortably; call light within reach.

## 2024-03-08 NOTE — Plan of Care (Signed)

## 2024-03-08 NOTE — ED Triage Notes (Signed)
 Patient c/o abdominal pain and nausea that started last night. Patient crying in triage due to pain in lower abdomen. Patient stated she has a hx Bowel obstruction and it feels the same she felt then.

## 2024-03-08 NOTE — Plan of Care (Signed)
  Problem: Clinical Measurements: Goal: Ability to maintain clinical measurements within normal limits will improve Outcome: Progressing   Problem: Clinical Measurements: Goal: Will remain free from infection Outcome: Progressing   Problem: Clinical Measurements: Goal: Diagnostic test results will improve Outcome: Progressing   Problem: Clinical Measurements: Goal: Respiratory complications will improve Outcome: Progressing   Problem: Clinical Measurements: Goal: Cardiovascular complication will be avoided Outcome: Progressing   Problem: Nutrition: Goal: Adequate nutrition will be maintained Outcome: Progressing   Problem: Pain Managment: Goal: General experience of comfort will improve and/or be controlled Outcome: Progressing   Problem: Safety: Goal: Ability to remain free from injury will improve Outcome: Progressing

## 2024-03-08 NOTE — ED Provider Notes (Signed)
 [place] EMERGENCY DEPARTMENT AT [facility] Provider Note  CSN: [id number] Arrival date & time: 03/08/24 0113  Chief Complaint(s) Abdominal Pain  HPI Cheryl Hanna is a 58 y.o. female here for rapidly worsening lower abdominal pain and distention similar to prior bowel obstruction that occurred 3 years ago.  Patient reports having a bowel movement earlier in the day.  Pain began around 7 PM.  Associated with nausea without emesis.  No urinary symptoms.  No other physical complaints.  The history is provided by the patient.    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 Authorizing Provider  indapamide  (LOZOL ) 1.25 MG tablet Take 1 tablet (1.25 mg total) by mouth daily. 08/09/23   Arcadio Knuckles, MD  lubiprostone  (AMITIZA ) 24 MCG capsule TAKE 1 CAPSULE (24 MCG TOTAL) BY MOUTH 2 (TWO) TIMES DAILY WITH A MEAL. TAKE ONE CAPSULE BY MOUTH TWICE DAILY WITH A MEAL 01/27/24   Arcadio Knuckles, MD  olmesartan  (BENICAR ) 20 MG tablet TAKE 1 TABLET(20 MG) BY MOUTH DAILY 11/27/23   Arcadio Knuckles, MD                                                                                                                                    Allergies Patient has no known allergies.  Review of Systems Review of Systems As noted in HPI  Physical Exam Vital Signs  I have reviewed the triage vital signs BP (!) 145/87   Pulse 79   Temp  97.6 F (36.4 C) (Oral)   Resp 14   SpO2 96%   Physical Exam Vitals reviewed.  Constitutional:      General: She is not in acute distress.    Appearance: She is well-developed. She is not diaphoretic.  HENT:     Head: Normocephalic and atraumatic.     Right Ear: External ear normal.     Left Ear: External ear normal.     Nose: Nose normal.  Eyes:     General: No scleral icterus.    Conjunctiva/sclera: Conjunctivae normal.  Neck:     Trachea: Phonation normal.  Cardiovascular:     Rate and Rhythm: Normal rate and regular rhythm.  Pulmonary:     Effort: Pulmonary effort is normal. No respiratory distress.     Breath sounds: No stridor.  Abdominal:     General: There is distension.  Tenderness: There is abdominal tenderness. There is guarding.  Musculoskeletal:        General: Normal range of motion.     Cervical back: Normal range of motion.  Neurological:     Mental Status: She is alert and oriented to person, place, and time.  Psychiatric:        Behavior: Behavior normal.     ED Results and Treatments Labs (all labs ordered are listed, but only abnormal results are displayed) Labs Reviewed  COMPREHENSIVE METABOLIC PANEL WITH GFR - Abnormal; Notable for the following components:      Result Value   Glucose, Bld 112 (*)    BUN 23 (*)    All other components within normal limits  LIPASE, BLOOD  CBC WITH DIFFERENTIAL/PLATELET  HCG, SERUM, QUALITATIVE  URINALYSIS, ROUTINE W REFLEX MICROSCOPIC                                                                                                                         EKG  EKG Interpretation Date/Time:    Ventricular Rate:    PR Interval:    QRS Duration:    QT Interval:    QTC Calculation:   R Axis:      Text Interpretation:         Radiology CT ABDOMEN PELVIS W CONTRAST Result Date: 03/08/2024 CLINICAL DATA:  Evaluate for bowel obstruction. EXAM: CT ABDOMEN AND PELVIS WITH CONTRAST TECHNIQUE:  Multidetector CT imaging of the abdomen and pelvis was performed using the standard protocol following bolus administration of intravenous contrast. RADIATION DOSE REDUCTION: This exam was performed according to the departmental dose-optimization program which includes automated exposure control, adjustment of the mA and/or kV according to patient size and/or use of iterative reconstruction technique. CONTRAST:  75mL OMNIPAQUE  IOHEXOL  300 MG/ML  SOLN COMPARISON:  11/21/2020 FINDINGS: Lower chest: No acute abnormality. Hepatobiliary: No focal liver abnormality is seen. No gallstones, gallbladder wall thickening, or biliary dilatation. Pancreas: Unremarkable. No pancreatic ductal dilatation or surrounding inflammatory changes. Spleen: Normal in size without focal abnormality. Adrenals/Urinary Tract: Normal adrenal glands. No nephrolithiasis, hydronephrosis or suspicious mass. Urinary bladder appears normal. Stomach/Bowel: Stomach appears normal. A distal small bowel obstruction is identified proximal to Leigh ileocecal valve with transition point in the right lower quadrant of the abdomen, image 45/7 in image 42/2. No underlying mass identified. Proximal to the transition point there is fecalization of the dilated small bowel loops which measure up to 3 cm. There is edema/congestion within the adjacent small bowel mesentery. The appendix is visualized and appears normal. Colon has a normal caliber. Mild stool burden noted throughout the colon up to the level of the rectum. Vascular/Lymphatic: Normal caliber of the abdominal aorta. No signs of abdominopelvic adenopathy. Reproductive: Uterus and bilateral adnexa are unremarkable. Other: No free fluid or fluid collections. Musculoskeletal: No acute or significant osseous findings. IMPRESSION: 1. Imaging findings are compatible with distal small bowel obstruction proximal to the ileocecal valve. 2. No signs of perforation.  Electronically Signed   By: Kimberley Penman M.D.    On: 03/08/2024 04:34    Medications Ordered in ED Medications  HYDROmorphone  (DILAUDID ) injection 1 mg (1 mg Intravenous Given 03/08/24 0430)  sodium chloride  0.9 % bolus 1,000 mL ( Intravenous Rate/Dose Verify 03/08/24 0351)  iohexol  (OMNIPAQUE ) 300 MG/ML solution 75 mL (75 mLs Intravenous Contrast Given 03/08/24 0330)   Procedures Procedures  (including critical care time) Medical Decision Making / ED Course   Medical Decision Making Amount and/or Complexity of Data Reviewed Labs: ordered. Decision-making details documented in ED Course. Radiology: ordered and independent interpretation performed. Decision-making details documented in ED Course.  Risk Prescription drug management. Parenteral controlled substances. Decision regarding hospitalization.    Abdominal pain with distention and firmness concerning for bowel obstruction given her prior history.  Labs reassuring without leukocytosis or anemia.  No significant electrolyte derangements or renal sufficiency.  No evidence of bili obstruction or pancreatitis.  Provided with IVF and pain meds.  CT confirmed SBO. NGT placed. Pain controlled. Spoke with Dr. Del Favia from Hospitalist team who will admit.    Final Clinical Impression(s) / ED Diagnoses Final diagnoses:  SBO (small bowel obstruction) (HCC)    This chart was dictated using voice recognition software.  Despite best efforts to proofread,  errors can occur which can change the documentation meaning.    Lindle Rhea, MD 03/08/24 [phone]

## 2024-03-08 NOTE — H&P (Signed)
 History and Physical    Patient: Cheryl Hanna ZDG:[id number] DOB: Mar 05, 1966 DOA: 03/08/2024 DOS: the patient was seen and examined on 03/08/2024 PCP: Arcadio Knuckles, MD  Patient coming from: Home  Chief Complaint:  Chief Complaint  Patient presents with   Abdominal Pain   HPI:  58 year old woman PMH including C-section, small bowel obstruction, hypertension, presenting to the emergency department with abdominal pain.  Noted for small bowel obstruction.  Symptoms began abruptly after 6 PM/22 with abdominal pain which progressed to severe.  Last bowel movement was around 6 PM yesterday.  No bowel movement or flatus since.  Review of Systems: As above Past Medical History:  Diagnosis Date   Bowel obstruction (HCC) 2022   Hypertension    Past Surgical History:  Procedure Laterality Date   CESAREAN SECTION  1993   DILATION AND CURETTAGE OF UTERUS     TUBAL LIGATION     Social History:  reports that she has never smoked. She has never used smokeless tobacco. She reports current alcohol use of about 4.0 - 6.0 standard drinks of alcohol per week. She reports that she does not use drugs.  No Known Allergies  Family History  Problem Relation Age of Onset   Hypertension Mother    Kidney disease Sister     Prior to Admission medications   Medication Sig Start Date End Date Taking? Authorizing Provider  lubiprostone  (AMITIZA ) 24 MCG capsule TAKE 1 CAPSULE (24 MCG TOTAL) BY MOUTH 2 (TWO) TIMES DAILY WITH A MEAL. TAKE ONE CAPSULE BY MOUTH TWICE DAILY WITH A MEAL 01/27/24  Yes Arcadio Knuckles, MD  olmesartan  (BENICAR ) 20 MG tablet TAKE 1 TABLET(20 MG) BY MOUTH DAILY 11/27/23  Yes Arcadio Knuckles, MD  indapamide  (LOZOL ) 1.25 MG tablet Take 1 tablet (1.25 mg total) by mouth daily. Patient not taking: Reported on 03/08/2024 08/09/23   Arcadio Knuckles, MD    Physical Exam: Vitals:   03/08/24 0300 03/08/24 0430 03/08/24 0500 03/08/24 0729  BP:  (!) 161/97 (!) 145/87 138/87  Pulse: 81 79  79 68  Resp: 12 13 14 15   Temp:    98.1 F (36.7 C)  TempSrc:    Oral  SpO2: 94% 99% 96% 100%   Physical Exam Vitals reviewed.  Constitutional:      General: She is not in acute distress.    Appearance: She is not ill-appearing or toxic-appearing.  HENT:     Mouth/Throat:     Mouth: Mucous membranes are moist.  Eyes:     General: No scleral icterus.       Right eye: No discharge.        Left eye: No discharge.  Cardiovascular:     Rate and Rhythm: Normal rate and regular rhythm.     Heart sounds: No murmur heard. Pulmonary:     Effort: No respiratory distress.     Breath sounds: No wheezing or rales.  Chest:     Chest wall: No tenderness.  Abdominal:     General: There is no distension.     Palpations: Abdomen is soft.     Tenderness: There is abdominal tenderness. There is no guarding.  Musculoskeletal:     Right lower leg: No edema.     Left lower leg: No edema.  Neurological:     Mental Status: She is alert.  Psychiatric:        Mood and Affect: Mood normal.        Behavior: Behavior normal.  Data Reviewed: Basic metabolic panel unremarkable LFTs within normal limits CBC within normal limits CT abdomen pelvis showed distal small bowel obstruction.  Assessment and Plan: Distal small bowel obstruction PMH cesarean section No flatus.  Continue NG tube.  Start IV fluids.  Daily labs.  General surgery consultation.  Essential hypertension Stable. Hold meds.  Chronic idiopathic constipation Can resume bowel regimen when small bowel obstruction resolved   Advance Care Planning: Full code  Consults: general surgery  Family Communication: none  Severity of Illness: The appropriate patient status for this patient is INPATIENT. Inpatient status is judged to be reasonable and necessary in order to provide the required intensity of service to ensure the patient's safety. The patient's presenting symptoms, physical exam findings, and initial radiographic and  laboratory data in the context of their chronic comorbidities is felt to place them at high risk for further clinical deterioration. Furthermore, it is not anticipated that the patient will be medically stable for discharge from the hospital within 2 midnights of admission.   * I certify that at the point of admission it is my clinical judgment that the patient will require inpatient hospital care spanning beyond 2 midnights from the point of admission due to high intensity of service, high risk for further deterioration and high frequency of surveillance required.*  Author: Jerline Moon, MD 03/08/2024 8:25 AM  For on call review [email or web address].

## 2024-03-08 NOTE — Consult Note (Signed)
 Consult Note  Cheryl Hanna Aug 24, 1966  [id number].    Requesting MD: Jerline Moon, MD Chief Complaint/Reason for Consult: SBO HPI:  Patient is a 58 year old female who presented to the ED with abdominal pain and nausea that started around 7 PM yesterday. Pain was in lower abdomen and cramping in nature. No flatus or BM. Feels similar to previous SBO a few years ago. Her previous SBO resolved without surgical intervention. She has some constipation at baseline and is on medication for this. No prior colonoscopy. No hx of IBD or family hx of IBD. Prior abdominal surgery includes cesarean section and tubal ligation. PMH otherwise significant for HTN. Not on any blood thinners and NKDA.   ROS: Negative other than HPI  Family History  Problem Relation Age of Onset   Hypertension Mother    Kidney disease Sister     Past Medical History:  Diagnosis Date   Bowel obstruction (HCC) 2022   Hypertension     Past Surgical History:  Procedure Laterality Date   CESAREAN SECTION  1993   DILATION AND CURETTAGE OF UTERUS     TUBAL LIGATION      Social History:  reports that she has never smoked. She has never used smokeless tobacco. She reports current alcohol use of about 4.0 - 6.0 standard drinks of alcohol per week. She reports that she does not use drugs.  Allergies: No Known Allergies  Medications Prior to Admission  Medication Sig Dispense Refill   lubiprostone  (AMITIZA ) 24 MCG capsule TAKE 1 CAPSULE (24 MCG TOTAL) BY MOUTH 2 (TWO) TIMES DAILY WITH A MEAL. TAKE ONE CAPSULE BY MOUTH TWICE DAILY WITH A MEAL 180 capsule 0   olmesartan  (BENICAR ) 20 MG tablet TAKE 1 TABLET(20 MG) BY MOUTH DAILY 90 tablet 1   indapamide  (LOZOL ) 1.25 MG tablet Take 1 tablet (1.25 mg total) by mouth daily. (Patient not taking: Reported on 03/08/2024) 90 tablet 1    Blood pressure 138/87, pulse 68, temperature 98.1 F (36.7 C), temperature source Oral, resp. rate 15, SpO2 100%. Physical Exam:   General: pleasant, WD, WN female who is laying in bed in NAD HEENT: head is normocephalic, atraumatic.  Sclera are noninjected.  PERRL.  Ears and nose without any masses or lesions.  Mouth is pink and moist Heart: regular, rate, and rhythm.   Lungs: Respiratory effort nonlabored Abd: soft, mild ttp over lower abdomen without peritonitis, mild distention, NGT with thin drainage MS: all 4 extremities are symmetrical with no cyanosis, clubbing, or edema. Skin: warm and dry with no masses, lesions, or rashes Neuro: Cranial nerves 2-12 grossly intact, sensation is normal throughout Psych: A&Ox3 with an appropriate affect.   Results for orders placed or performed during the hospital encounter of 03/08/24 (from the past 48 hours)  Comprehensive metabolic panel     Status: Abnormal   Collection Time: 03/08/24  1:38 AM  Result Value Ref Range   Sodium 137 135 - 145 mmol/L   Potassium 4.4 3.5 - 5.1 mmol/L   Chloride 103 98 - 111 mmol/L   CO2 26 22 - 32 mmol/L   Glucose, Bld 112 (H) 70 - 99 mg/dL    Comment: Glucose reference range applies only to samples taken after fasting for at least 8 hours.   BUN 23 (H) 6 - 20 mg/dL   Creatinine, Ser 6.43 0.44 - 1.00 mg/dL   Calcium 32.9 8.9 - 51.8 mg/dL   Total Protein 7.7 6.5 - 8.1  g/dL   Albumin 4.5 3.5 - 5.0 g/dL   AST 29 15 - 41 U/L   ALT 41 0 - 44 U/L   Alkaline Phosphatase 72 38 - 126 U/L   Total Bilirubin 0.7 0.0 - 1.2 mg/dL   GFR, Estimated >28 >41 mL/min    Comment: (NOTE) Calculated using the CKD-EPI Creatinine Equation (2021)    Anion gap 8 5 - 15    Comment: Performed at [facility], [address] [place], [place] [address]  Lipase, blood     Status: None   Collection Time: 03/08/24  1:38 AM  Result Value Ref Range   Lipase 35 11 - 51 U/L    Comment: Performed at [facility], [address], [place], [place] [address]  CBC with Diff     Status: None   Collection Time: 03/08/24  1:38 AM   Result Value Ref Range   WBC 5.6 4.0 - 10.5 K/uL   RBC 4.76 3.87 - 5.11 MIL/uL   Hemoglobin 14.7 12.0 - 15.0 g/dL   HCT 53.6 64.4 - 03.4 %   MCV 92.4 80.0 - 100.0 fL   MCH 30.9 26.0 - 34.0 pg   MCHC 33.4 30.0 - 36.0 g/dL   RDW 74.2 59.5 - 63.8 %   Platelets 209 150 - 400 K/uL   nRBC 0.0 0.0 - 0.2 %   Neutrophils Relative % 39 %   Neutro Abs 2.2 1.7 - 7.7 K/uL   Lymphocytes Relative 42 %   Lymphs Abs 2.4 0.7 - 4.0 K/uL   Monocytes Relative 10 %   Monocytes Absolute 0.6 0.1 - 1.0 K/uL   Eosinophils Relative 8 %   Eosinophils Absolute 0.5 0.0 - 0.5 K/uL   Basophils Relative 1 %   Basophils Absolute 0.1 0.0 - 0.1 K/uL   Immature Granulocytes 0 %   Abs Immature Granulocytes 0.02 0.00 - 0.07 K/uL    Comment: Performed at [facility], [address], [place], [place] [address]  hCG, serum, qualitative     Status: None   Collection Time: 03/08/24  1:38 AM  Result Value Ref Range   Preg, Serum NEGATIVE NEGATIVE    Comment:        THE SENSITIVITY OF THIS METHODOLOGY IS >10 mIU/mL. Performed at [facility], [address], [place], [place] [address]    DG Abd Portable 1V Result Date: 03/08/2024 CLINICAL DATA:  Evaluate nasogastric tube placement EXAM: PORTABLE ABDOMEN - 1 VIEW COMPARISON:  CT from earlier today FINDINGS: Enteric tube tip and side port are below the GE junction. The tip is in the expected location of the distal body or proximal antrum of stomach. Moderate retained stool identified. Excreted contrast material noted within the renal collecting systems and bladder. IMPRESSION: Enteric tube tip and side port are in the expected location of the stomach. Electronically Signed   By: Kimberley Penman M.D.   On: 03/08/2024 05:45   CT ABDOMEN PELVIS W CONTRAST Result Date: 03/08/2024 CLINICAL DATA:  Evaluate for bowel obstruction. EXAM: CT ABDOMEN AND PELVIS WITH CONTRAST TECHNIQUE: Multidetector CT imaging of the abdomen and pelvis was  performed using the standard protocol following bolus administration of intravenous contrast. RADIATION DOSE REDUCTION: This exam was performed according to the departmental dose-optimization program which includes automated exposure control, adjustment of the mA and/or kV according to patient size and/or use of iterative reconstruction technique. CONTRAST:  75mL OMNIPAQUE  IOHEXOL  300 MG/ML  SOLN COMPARISON:  11/21/2020 FINDINGS: Lower chest: No acute abnormality. Hepatobiliary: No focal liver abnormality is seen. No gallstones, gallbladder wall thickening, or biliary dilatation. Pancreas: Unremarkable. No pancreatic ductal dilatation or surrounding inflammatory changes. Spleen: Normal in size without focal abnormality. Adrenals/Urinary Tract: Normal adrenal glands. No nephrolithiasis, hydronephrosis or suspicious mass. Urinary bladder appears normal. Stomach/Bowel: Stomach appears normal. A distal small bowel obstruction is identified proximal to Leigh ileocecal valve with transition point in the right lower quadrant of the abdomen, image 45/7 in image 42/2. No underlying mass identified. Proximal to the transition point there is fecalization of the dilated small bowel loops which measure up to 3 cm. There is edema/congestion within the adjacent small bowel mesentery. The appendix is visualized and appears normal. Colon has a normal caliber. Mild stool burden noted throughout the colon up to the level of the rectum. Vascular/Lymphatic: Normal caliber of the abdominal aorta. No signs of abdominopelvic adenopathy. Reproductive: Uterus and bilateral adnexa are unremarkable. Other: No free fluid or fluid collections. Musculoskeletal: No acute or significant osseous findings. IMPRESSION: 1. Imaging findings are compatible with distal small bowel obstruction proximal to the ileocecal valve. 2. No signs of perforation. Electronically Signed   By: Kimberley Penman M.D.   On: 03/08/2024 04:34      Assessment/Plan SBO -  CT early this AM with distal SBO - pt has had previous SBO that resolved with conservative management, also has issues with constipation at baseline. Takes lubiprostone  - NGT in place - recommend SBO protocol - no indication for emergent surgical intervention at this time but we will follow - ok to clamp to mobilize - keep K>4.0 and Mg >2.0 to optimize bowel function   FEN: NPO, NGT to LIWS, IVF per [facility] VTE: ok to have LMWH or SQH ID: no current abx   I reviewed ED provider notes, last 24 h vitals and pain scores, last 48 h intake and output, last 24 h labs and trends, and last 24 h imaging results.  This care required high  level of medical decision making.   Annetta Killian, [facility] Surgery 03/08/2024, 9:27 AM Please see Amion for pager number during day hours 7:00am-4:30pm

## 2024-03-08 NOTE — Hospital Course (Signed)
 Carryover for 03/08/2024:   The patient is a 58 year old female with past medical history significant for prior SBO managed nonoperatively, who presents with severe diffuse abdominal pain and nausea that felt like prior episode of SBO.  Symptoms started yesterday evening.   In the ED, CT abdomen and pelvis revealed distal small bowel obstruction prior to ileocecal junction.  NG tube placed in the ER.  May benefit from general surgery consultation in the morning.     Consultants General surgery  Procedures/Events

## 2024-03-09 DIAGNOSIS — I10 Essential (primary) hypertension: Secondary | ICD-10-CM | POA: Diagnosis not present

## 2024-03-09 DIAGNOSIS — K56609 Unspecified intestinal obstruction, unspecified as to partial versus complete obstruction: Secondary | ICD-10-CM | POA: Diagnosis not present

## 2024-03-09 LAB — BASIC METABOLIC PANEL WITH GFR
Anion gap: 9 (ref 5–15)
BUN: 9 mg/dL (ref 6–20)
CO2: 25 mmol/L (ref 22–32)
Calcium: 8.8 mg/dL — ABNORMAL LOW (ref 8.9–10.3)
Chloride: 104 mmol/L (ref 98–111)
Creatinine, Ser: 0.56 mg/dL (ref 0.44–1.00)
GFR, Estimated: >60 mL/min (ref >60)
Glucose, Bld: 87 mg/dL (ref 70–99)
Potassium: 3.7 mmol/L (ref 3.5–5.1)
Sodium: 138 mmol/L (ref 135–145)

## 2024-03-09 LAB — CBC
HCT: 42.4 % (ref 36.0–46.0)
Hemoglobin: 14.1 g/dL (ref 12.0–15.0)
MCH: 30.7 pg (ref 26.0–34.0)
MCHC: 33.3 g/dL (ref 30.0–36.0)
MCV: 92.2 fL (ref 80.0–100.0)
Platelets: 181 10*3/uL (ref 150–400)
RBC: 4.6 MIL/uL (ref 3.87–5.11)
RDW: 13.1 % (ref 11.5–15.5)
WBC: 5.6 10*3/uL (ref 4.0–10.5)
nRBC: 0 % (ref 0.0–0.2)

## 2024-03-09 LAB — HIV ANTIBODY (ROUTINE TESTING W REFLEX): HIV Screen 4th Generation wRfx: NONREACTIVE

## 2024-03-09 NOTE — Progress Notes (Signed)
 Subjective: CC: Abdominal pain and nausea resolved. 150cc/24 hours from NGT. Passing flatus. BM yesterday. Xray w/ contrast in colon.   Objective: Vital signs in last 24 hours: Temp:  [97.9 F (36.6 C)-98.7 F (37.1 C)] 97.9 F (36.6 C) (04/24 0514) Pulse Rate:  [64-74] 66 (04/24 0514) Resp:  [17-18] 18 (04/24 0514) BP: (118-152)/(68-92) 136/92 (04/24 0514) SpO2:  [97 %-99 %] 98 % (04/24 0514) Last BM Date : 03/09/24  Intake/Output from previous day: 04/23 0701 - 04/24 0700 In: 862.3 [I.V.:862.3] Out: 150 [Emesis/NG output:150] Intake/Output this shift: No intake/output data recorded.  PE: Gen:  Alert, NAD, pleasant Abd: Soft, ND, mild RLQ ttp - otherwise NT. No rigidity or guarding. +BS. NGT in place on LIWS.   Lab Results:  Recent Labs    03/08/24 0138 03/09/24 0435  WBC 5.6 5.6  HGB 14.7 14.1  HCT 44.0 42.4  PLT 209 181   BMET Recent Labs    03/08/24 0138 03/09/24 0435  NA 137 138  K 4.4 3.7  CL 103 104  CO2 26 25  GLUCOSE 112* 87  BUN 23* 9  CREATININE 0.61 0.56  CALCIUM 10.0 8.8*   PT/INR No results for input(s): "LABPROT", "INR" in the last 72 hours. CMP     Component Value Date/Time   NA 138 03/09/2024 0435   K 3.7 03/09/2024 0435   CL 104 03/09/2024 0435   CO2 25 03/09/2024 0435   GLUCOSE 87 03/09/2024 0435   BUN 9 03/09/2024 0435   CREATININE 0.56 03/09/2024 0435   CALCIUM 8.8 (L) 03/09/2024 0435   PROT 7.7 03/08/2024 0138   ALBUMIN 4.5 03/08/2024 0138   AST 29 03/08/2024 0138   ALT 41 03/08/2024 0138   ALKPHOS 72 03/08/2024 0138   BILITOT 0.7 03/08/2024 0138   GFRNONAA >60 03/09/2024 0435   Lipase     Component Value Date/Time   LIPASE 35 03/08/2024 0138    Studies/Results: DG Abd Portable 1V-Small Bowel Obstruction Protocol-initial, 8 hr delay Result Date: 03/08/2024 CLINICAL DATA:  SBO 8 hour delay EXAM: PORTABLE ABDOMEN - 1 VIEW COMPARISON:  03/08/2024, CT 03/08/2024 FINDINGS: Enteric tube tip in the stomach.  Enteral contrast in the stomach and nondilated small bowel. Contrast is present in the colon, to the level of the sigmoid colon. IMPRESSION: Enteral contrast in the stomach and nondilated small bowel. Contrast is present in the colon, to the level of the sigmoid colon. Electronically Signed   By: Esmeralda Hedge M.D.   On: 03/08/2024 22:46   DG Abd Portable 1V Result Date: 03/08/2024 CLINICAL DATA:  Evaluate nasogastric tube placement EXAM: PORTABLE ABDOMEN - 1 VIEW COMPARISON:  CT from earlier today FINDINGS: Enteric tube tip and side port are below the GE junction. The tip is in the expected location of the distal body or proximal antrum of stomach. Moderate retained stool identified. Excreted contrast material noted within the renal collecting systems and bladder. IMPRESSION: Enteric tube tip and side port are in the expected location of the stomach. Electronically Signed   By: Kimberley Penman M.D.   On: 03/08/2024 05:45   CT ABDOMEN PELVIS W CONTRAST Result Date: 03/08/2024 CLINICAL DATA:  Evaluate for bowel obstruction. EXAM: CT ABDOMEN AND PELVIS WITH CONTRAST TECHNIQUE: Multidetector CT imaging of the abdomen and pelvis was performed using the standard protocol following bolus administration of intravenous contrast. RADIATION DOSE REDUCTION: This exam was performed according to the departmental dose-optimization program which includes automated exposure control, adjustment  of the mA and/or kV according to patient size and/or use of iterative reconstruction technique. CONTRAST:  75mL OMNIPAQUE  IOHEXOL  300 MG/ML  SOLN COMPARISON:  11/21/2020 FINDINGS: Lower chest: No acute abnormality. Hepatobiliary: No focal liver abnormality is seen. No gallstones, gallbladder wall thickening, or biliary dilatation. Pancreas: Unremarkable. No pancreatic ductal dilatation or surrounding inflammatory changes. Spleen: Normal in size without focal abnormality. Adrenals/Urinary Tract: Normal adrenal glands. No nephrolithiasis,  hydronephrosis or suspicious mass. Urinary bladder appears normal. Stomach/Bowel: Stomach appears normal. A distal small bowel obstruction is identified proximal to Leigh ileocecal valve with transition point in the right lower quadrant of the abdomen, image 45/7 in image 42/2. No underlying mass identified. Proximal to the transition point there is fecalization of the dilated small bowel loops which measure up to 3 cm. There is edema/congestion within the adjacent small bowel mesentery. The appendix is visualized and appears normal. Colon has a normal caliber. Mild stool burden noted throughout the colon up to the level of the rectum. Vascular/Lymphatic: Normal caliber of the abdominal aorta. No signs of abdominopelvic adenopathy. Reproductive: Uterus and bilateral adnexa are unremarkable. Other: No free fluid or fluid collections. Musculoskeletal: No acute or significant osseous findings. IMPRESSION: 1. Imaging findings are compatible with distal small bowel obstruction proximal to the ileocecal valve. 2. No signs of perforation. Electronically Signed   By: Kimberley Penman M.D.   On: 03/08/2024 04:34    Anti-infectives: Anti-infectives (From admission, onward)    None        Assessment/Plan SBO - CT with distal SBO. Pt has had previous SBO that resolved with conservative management, also has issues with constipation at baseline. Takes lubiprostone . Hx of cesarean section and tubal ligation.  - no indication for emergent surgical intervention at this time but we will follow - Keep K >=4, Phos >= 3, Mg >= 2 and mobilize for bowel function - Clinically and radiographically resolving. Contrast in colon on xray and patient with return of bowel function. D/c NGT. Adv to FLD. ADAT to soft diet. If tolerates diet advancement, okay for d/c from our standpoint. Will send message to [facility].    FEN: D/c NGT. FLD. ADAT to soft. IVF per [facility] VTE: okay for chem ppx from a general surgery standpoint ID: no current  abx. Afebrile. WBC wnl  I reviewed nursing notes, last 24 h vitals and pain scores, last 48 h intake and output, last 24 h labs and trends, and last 24 h imaging results.   LOS: 1 day    Delton Filbert, [facility] Surgery 03/09/2024, 8:41 AM Please see Amion for pager number during day hours 7:00am-4:30pm

## 2024-03-09 NOTE — Discharge Summary (Signed)
 Physician Discharge Summary   Patient: Cheryl Hanna MRN: [id number] DOB: 06-22-1966  Admit date:     03/08/2024  Discharge date: 03/09/24  Discharge Physician: Jerline Moon   PCP: Arcadio Knuckles, MD   Recommendations at discharge:   Routine care  Discharge Diagnoses: Principal Problem:   SBO (small bowel obstruction) (HCC) Active Problems:   Primary hypertension  Resolved Problems:   * No resolved hospital problems. *  Hospital Course: 58 year old woman PMH including C-section, small bowel obstruction, hypertension, presenting to the emergency department with abdominal pain.  Admitted for small bowel obstruction.  Seen by general surgery, treated conservatively with small bowel protocol with spontaneous resolution.  Discharged home in good condition.  Consultants General surgery  Procedures/Events None    Assessment and Plan: Distal small bowel obstruction PMH cesarean section Resolved with conservative management.  Moderating diet.  Discharged home in good condition.   Essential hypertension Stable.    Chronic idiopathic constipation Can resume home regimen  Disposition: Home Diet recommendation:  Soft diet  DISCHARGE MEDICATION: Allergies as of 03/09/2024   No Known Allergies      Medication List     TAKE these medications    indapamide  1.25 MG tablet Commonly known as: LOZOL  Take 1 tablet (1.25 mg total) by mouth daily.   lubiprostone  24 MCG capsule Commonly known as: AMITIZA  TAKE 1 CAPSULE (24 MCG TOTAL) BY MOUTH 2 (TWO) TIMES DAILY WITH A MEAL. TAKE ONE CAPSULE BY MOUTH TWICE DAILY WITH A MEAL   olmesartan  20 MG tablet Commonly known as: BENICAR  TAKE 1 TABLET(20 MG) BY MOUTH DAILY        Follow-up Information     Arcadio Knuckles, MD Follow up.   Specialty: Internal Medicine Why: As needed Contact information: [address] [place] [address] [phone]                Discharge Exam: Feels better Physical  Exam Vitals reviewed.  Constitutional:      General: She is not in acute distress.    Appearance: She is not ill-appearing or toxic-appearing.  Cardiovascular:     Rate and Rhythm: Normal rate and regular rhythm.     Heart sounds: No murmur heard. Pulmonary:     Effort: Pulmonary effort is normal. No respiratory distress.     Breath sounds: No wheezing, rhonchi or rales.  Neurological:     Mental Status: She is alert.  Psychiatric:        Mood and Affect: Mood normal.        Behavior: Behavior normal.   Basic metabolic panel unremarkable CBC unremarkable   Condition at discharge: good  The results of significant diagnostics from this hospitalization (including imaging, microbiology, ancillary and laboratory) are listed below for reference.   Imaging Studies: DG Abd Portable 1V-Small Bowel Obstruction Protocol-initial, 8 hr delay Result Date: 03/08/2024 CLINICAL DATA:  SBO 8 hour delay EXAM: PORTABLE ABDOMEN - 1 VIEW COMPARISON:  03/08/2024, CT 03/08/2024 FINDINGS: Enteric tube tip in the stomach. Enteral contrast in the stomach and nondilated small bowel. Contrast is present in the colon, to the level of the sigmoid colon. IMPRESSION: Enteral contrast in the stomach and nondilated small bowel. Contrast is present in the colon, to the level of the sigmoid colon. Electronically Signed   By: Esmeralda Hedge M.D.   On: 03/08/2024 22:46   DG Abd Portable 1V Result Date: 03/08/2024 CLINICAL DATA:  Evaluate nasogastric tube placement EXAM: PORTABLE ABDOMEN - 1 VIEW COMPARISON:  CT from earlier today FINDINGS: Enteric tube tip and side port are below the GE junction. The tip is in the expected location of the distal body or proximal antrum of stomach. Moderate retained stool identified. Excreted contrast material noted within the renal collecting systems and bladder. IMPRESSION: Enteric tube tip and side port are in the expected location of the stomach. Electronically Signed   By: Kimberley Penman  M.D.   On: 03/08/2024 05:45   CT ABDOMEN PELVIS W CONTRAST Result Date: 03/08/2024 CLINICAL DATA:  Evaluate for bowel obstruction. EXAM: CT ABDOMEN AND PELVIS WITH CONTRAST TECHNIQUE: Multidetector CT imaging of the abdomen and pelvis was performed using the standard protocol following bolus administration of intravenous contrast. RADIATION DOSE REDUCTION: This exam was performed according to the departmental dose-optimization program which includes automated exposure control, adjustment of the mA and/or kV according to patient size and/or use of iterative reconstruction technique. CONTRAST:  75mL OMNIPAQUE  IOHEXOL  300 MG/ML  SOLN COMPARISON:  11/21/2020 FINDINGS: Lower chest: No acute abnormality. Hepatobiliary: No focal liver abnormality is seen. No gallstones, gallbladder wall thickening, or biliary dilatation. Pancreas: Unremarkable. No pancreatic ductal dilatation or surrounding inflammatory changes. Spleen: Normal in size without focal abnormality. Adrenals/Urinary Tract: Normal adrenal glands. No nephrolithiasis, hydronephrosis or suspicious mass. Urinary bladder appears normal. Stomach/Bowel: Stomach appears normal. A distal small bowel obstruction is identified proximal to Leigh ileocecal valve with transition point in the right lower quadrant of the abdomen, image 45/7 in image 42/2. No underlying mass identified. Proximal to the transition point there is fecalization of the dilated small bowel loops which measure up to 3 cm. There is edema/congestion within the adjacent small bowel mesentery. The appendix is visualized and appears normal. Colon has a normal caliber. Mild stool burden noted throughout the colon up to the level of the rectum. Vascular/Lymphatic: Normal caliber of the abdominal aorta. No signs of abdominopelvic adenopathy. Reproductive: Uterus and bilateral adnexa are unremarkable. Other: No free fluid or fluid collections. Musculoskeletal: No acute or significant osseous findings.  IMPRESSION: 1. Imaging findings are compatible with distal small bowel obstruction proximal to the ileocecal valve. 2. No signs of perforation. Electronically Signed   By: Kimberley Penman M.D.   On: 03/08/2024 04:34    Microbiology: Results for orders placed or performed during the hospital encounter of 11/21/20  SARS CORONAVIRUS 2 (TAT 6-24 HRS) Nasopharyngeal Nasopharyngeal Swab     Status: Abnormal   Collection Time: 11/21/20  1:34 AM   Specimen: Nasopharyngeal Swab  Result Value Ref Range Status   SARS Coronavirus 2 POSITIVE (A) NEGATIVE Final    Comment: Emailed Lori Berdik @ 2340 on 11/21/2020 by L Lamont (NOTE) SARS-CoV-2 target nucleic acids are DETECTED.  The SARS-CoV-2 RNA is generally detectable in upper and lower respiratory specimens during the acute phase of infection. Positive results are indicative of the presence of SARS-CoV-2 RNA. Clinical correlation with patient history and other diagnostic information is  necessary to determine patient infection status. Positive results do not rule out bacterial infection or co-infection with other viruses.  The expected result is Negative.  Fact Sheet for Patients: HairSlick.no  Fact Sheet for Healthcare Providers: quierodirigir.com  This test is not yet approved or cleared by the [place]  FDA and  has been authorized for detection and/or diagnosis of SARS-CoV-2 by FDA under an Emergency Use Authorization (EUA). This EUA will remain  in effect (meaning this test can be used) for the duration of the COVID-19 d eclaration under Section 564(b)(1) of the Act,  21 U.S.C. section 360bbb-3(b)(1), unless the authorization is terminated or revoked sooner.   Performed at [facility], [address], [place], [place] [address]     Labs: CBC: Recent Labs  Lab 03/08/24 0138 03/09/24 0435  WBC 5.6 5.6  NEUTROABS 2.2  --   HGB 14.7 14.1  HCT 44.0 42.4  MCV 92.4  92.2  PLT 209 181   Basic Metabolic Panel: Recent Labs  Lab 03/08/24 0138 03/09/24 0435  NA 137 138  K 4.4 3.7  CL 103 104  CO2 26 25  GLUCOSE 112* 87  BUN 23* 9  CREATININE 0.61 0.56  CALCIUM 10.0 8.8*   Liver Function Tests: Recent Labs  Lab 03/08/24 0138  AST 29  ALT 41  ALKPHOS 72  BILITOT 0.7  PROT 7.7  ALBUMIN 4.5   CBG: Recent Labs  Lab 03/08/24 1512  GLUCAP 81    Discharge time spent: less than 30 minutes.  Signed: Jerline Moon, MD [facility] 03/09/2024

## 2024-03-09 NOTE — TOC Initial Note (Signed)
 Transition of Care [facility]) - Initial/Assessment Note    Patient Details  Name: Cheryl Hanna MRN: [id number] Date of Birth: [birth year]  Transition of Care [facility]) CM/SW Contact:    Kathryn Parish, RN Phone Number: 03/09/2024, 12:57 PM  Clinical Narrative:                 Transition of Care [facility]) - Inpatient Brief Assessment   Patient Details  Name: Cheryl Hanna MRN: [id number] Date of Birth: 1966-10-06  Transition of Care [facility]) CM/SW Contact:    Kathryn Parish, RN Phone Number: 03/09/2024, 12:57 PM   Clinical Narrative: From Home . No TOC needs   Transition of Care Asessment: Insurance and Status: Insurance coverage has been reviewed Patient has primary care physician: Yes Home environment has been reviewed: home Prior level of function:: independent Prior/Current Home Services: No current home services Social Drivers of Health Review: SDOH reviewed no interventions necessary Readmission risk has been reviewed: Yes Transition of care needs: no transition of care needs at this time         Patient Goals and CMS Choice            Expected Discharge Plan and Services         Expected Discharge Date: 03/09/24                                    Prior Living Arrangements/Services                       Activities of Daily Living   ADL Screening (condition at time of admission) Independently performs ADLs?: Yes (appropriate for developmental age) Is the patient deaf or have difficulty hearing?: No Does the patient have difficulty seeing, even when wearing glasses/contacts?: No Does the patient have difficulty concentrating, remembering, or making decisions?: No  Permission Sought/Granted                  Emotional Assessment              Admission diagnosis:  SBO (small bowel obstruction) (HCC) [K56.609] Patient Active Problem List   Diagnosis Date Noted   SBO (small bowel obstruction) (HCC) 03/08/2024   Chronic  idiopathic constipation 08/09/2023   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 [address] [facility] [address] [place] [address] Phone: [phone] Fax: [phone]  [facility] - [place], [place] - [address] [facility] [address] [place] [address] Phone: [phone] Fax: [phone]  [id number] - [place], [place] - [address] [place] [address] Phone: [phone] Fax: [phone]     Social Drivers of Health (SDOH) Social History: SDOH Screenings   Food Insecurity: No Food Insecurity (03/08/2024)  Housing: Low Risk  (03/08/2024)  Transportation Needs: No Transportation Needs (03/08/2024)  Utilities: Not At Risk (03/08/2024)  Alcohol Screen: Low Risk  (07/07/2022)  Depression (PHQ2-9): Low Risk  (08/09/2023)  Tobacco Use: Low Risk  (03/08/2024)   SDOH Interventions:  Readmission Risk Interventions     No data to display

## 2024-03-10 ENCOUNTER — Telehealth: Payer: Self-pay

## 2024-03-10 NOTE — Transitions of Care (Post Inpatient/ED Visit) (Signed)
   03/10/2024  Name: Cheryl Hanna MRN: [id number] DOB: [birth year]  Today's TOC FU Call Status: Today's TOC FU Call Status:: Successful TOC FU Call Completed TOC FU Call Complete Date: 03/10/24 Patient's Name and Date of Birth confirmed.  Transition Care Management Follow-up Telephone Call Date of Discharge: 03/09/24 Discharge Facility: [facility]) Type of Discharge: Inpatient Admission Primary Inpatient Discharge Diagnosis:: intestional obstruction How have you been since you were released from the hospital?: Better Any questions or concerns?: No  Items Reviewed: Did you receive and understand the discharge instructions provided?: Yes Medications obtained,verified, and reconciled?: Yes (Medications Reviewed) Any new allergies since your discharge?: No Dietary orders reviewed?: Yes Do you have support at home?: Yes People in Home [RPT]: spouse  Medications Reviewed Today: Medications Reviewed Today     Reviewed by Darrall Ellison, LPN (Licensed Practical Nurse) on 03/10/24 at 1034  Med List Status: <None>   Medication Order Taking? Sig Documenting Provider Last Dose Status Informant  indapamide  (LOZOL ) 1.25 MG tablet [id number] No Take 1 tablet (1.25 mg total) by mouth daily.  Patient not taking: Reported on 03/08/2024   Arcadio Knuckles, MD Not Taking Active Self, Pharmacy Records  lubiprostone  (AMITIZA ) 24 MCG capsule [id number] No TAKE 1 CAPSULE (24 MCG TOTAL) BY MOUTH 2 (TWO) TIMES DAILY WITH A MEAL. TAKE ONE CAPSULE BY MOUTH TWICE DAILY WITH A MEAL Arcadio Knuckles, MD 03/07/2024 Evening Active Self, Pharmacy Records  olmesartan  (BENICAR ) 20 MG tablet [id number] No TAKE 1 TABLET(20 MG) BY MOUTH DAILY Arcadio Knuckles, MD 03/07/2024 Morning Active Self, Pharmacy Records           Med Note Basil Lim, DUROJAHYE' R   Wed Mar 08, 2024  5:51 AM) Pt claims does not match dispense record            Home Care and Equipment/Supplies: Were Home Health Services Ordered?: NA Any  new equipment or medical supplies ordered?: NA  Functional Questionnaire: Do you need assistance with bathing/showering or dressing?: No Do you need assistance with meal preparation?: No Do you need assistance with eating?: No Do you have difficulty maintaining continence: No Do you need assistance with getting out of bed/getting out of a chair/moving?: No Do you have difficulty managing or taking your medications?: No  Follow up appointments reviewed: PCP Follow-up appointment confirmed?: No (declined) Specialist Hospital Follow-up appointment confirmed?: NA Do you need transportation to your follow-up appointment?: No Do you understand care options if your condition(s) worsen?: Yes-patient verbalized understanding    SIGNATURE Darrall Ellison, LPN [facility] Nurse Health Advisor Direct Dial [phone]

## 2024-04-03 ENCOUNTER — Ambulatory Visit
Admission: RE | Admit: 2024-04-03 | Discharge: 2024-04-03 | Disposition: A | Discharge status: Home / Self Care | Source: Ambulatory Visit

## 2024-04-03 ENCOUNTER — Ambulatory Visit
Admission: RE | Admit: 2024-04-03 | Discharge: 2024-04-03 | Disposition: A | Discharge status: Home / Self Care | Source: Ambulatory Visit | Attending: Physician Assistant | Admitting: Physician Assistant

## 2024-04-03 DIAGNOSIS — B029 Zoster without complications: Secondary | ICD-10-CM

## 2024-04-03 DIAGNOSIS — B019 Varicella without complication: Secondary | ICD-10-CM

## 2024-04-03 HISTORY — DX: Varicella without complication: B01.9

## 2024-04-03 MED ORDER — VALACYCLOVIR HCL 1 G PO TABS: 1000.0000 mg | ORAL_TABLET | Freq: Three times a day (TID) | ORAL | 0 refills | Status: DC

## 2024-04-03 NOTE — ED Provider Notes (Signed)
 [facility]    CSN: [id number] Arrival date & time: 04/03/24  4098      History   Chief Complaint Chief Complaint  Patient presents with   Rash    HPI Cheryl Hanna is a 58 y.o. female.   Patient here today for patient here today for evaluation of rash to her right flank area that she first noticed a few days ago worsening.  She states she might of had a few small areas of rash about a week ago but she became sick 3 days ago and she really noticed rash at that time.  She does have history of chickenpox.  She initially thought she could have poison ivy however with the distribution of the rash she is more concerned for shingles.  She was recently in the hospital for twisted bowel which has happened in the past.  She does not report any issues with this.  The history is provided by the patient.  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   Cervical cancer screening 12/24/2020   Visit for screening mammogram 12/24/2020   Colon cancer screening 12/24/2020   Primary hypertension 12/24/2020   Encounter for general adult medical examination with abnormal findings 12/24/2020    Past Surgical History:  Procedure Laterality Date   CESAREAN SECTION  1993   DILATION AND CURETTAGE OF UTERUS     TUBAL LIGATION      OB History     Gravida  4   Para  3   Term  3   Preterm      AB  1   Living  3      SAB  1   IAB      Ectopic      Multiple      Live Births  3            Home Medications    Prior to Admission medications   Medication Sig Start Date End Date Taking?  Authorizing Provider  lubiprostone  (AMITIZA ) 24 MCG capsule TAKE 1 CAPSULE (24 MCG TOTAL) BY MOUTH 2 (TWO) TIMES DAILY WITH A MEAL. TAKE ONE CAPSULE BY MOUTH TWICE DAILY WITH A MEAL 01/27/24  Yes Arcadio Knuckles, MD  olmesartan  (BENICAR ) 20 MG tablet TAKE 1 TABLET(20 MG) BY MOUTH DAILY 11/27/23  Yes Arcadio Knuckles, MD  valACYclovir  (VALTREX ) 1000 MG tablet Take 1 tablet (1,000 mg total) by mouth 3 (three) times daily. 04/03/24  Yes Vernestine Gondola, PA-C  indapamide  (LOZOL ) 1.25 MG tablet Take 1 tablet (1.25 mg total) by mouth daily. Patient not taking: Reported on 03/08/2024 08/09/23   Arcadio Knuckles, MD    Family History Family History  Problem Relation Age of Onset   Hypertension Mother    Kidney disease Sister     Social History Social History   Tobacco Use   Smoking status: Never   Smokeless tobacco: Never  Vaping Use   Vaping status: Never Used  Substance Use Topics   Alcohol use: Yes    Alcohol/week: 4.0 - 6.0 standard drinks  of alcohol    Types: 4 - 6 Standard drinks or equivalent per week    Comment: rarely.   Drug use: Never     Allergies   Patient has no known allergies.   Review of Systems Review of Systems  Constitutional:  Negative for chills and fever.  Eyes:  Negative for discharge and redness.  Respiratory:  Negative for shortness of breath.   Gastrointestinal:  Negative for abdominal pain, nausea and vomiting.  Skin:  Positive for rash.     Physical Exam Triage Vital Signs ED Triage Vitals  Encounter Vitals Group     BP --      Systolic BP Percentile --      Diastolic BP Percentile --      Pulse --      Resp --      Temp --      Temp src --      SpO2 --      Weight 04/03/24 0926 102 lb 15.3 oz (46.7 kg)     Height 04/03/24 0926 5' (1.524 m)     Head Circumference --      Peak Flow --      Pain Score 04/03/24 0922 3     Pain Loc --      Pain Education --      Exclude from Growth Chart --    No data found.  Updated Vital Signs BP  131/87 (BP Location: Left Arm)   Pulse 75   Temp 97.7 F (36.5 C) (Oral)   Resp 18   Ht 5' (1.524 m)   Wt 102 lb 15.3 oz (46.7 kg)   SpO2 97%   BMI 20.11 kg/m   Visual Acuity Right Eye Distance:   Left Eye Distance:   Bilateral Distance:    Right Eye Near:   Left Eye Near:    Bilateral Near:     Physical Exam Vitals and nursing note reviewed.  Constitutional:      General: She is not in acute distress.    Appearance: Normal appearance. She is not ill-appearing.  HENT:     Head: Normocephalic and atraumatic.  Eyes:     Conjunctiva/sclera: Conjunctivae normal.  Cardiovascular:     Rate and Rhythm: Normal rate.  Pulmonary:     Effort: Pulmonary effort is normal. No respiratory distress.  Skin:    Comments: Erythematous maculopapular eruption noted to right flank wrapping to right lateral side following dermatomal pattern, rash does not cross midline  Neurological:     Mental Status: She is alert.  Psychiatric:        Mood and Affect: Mood normal.        Behavior: Behavior normal.        Thought Content: Thought content normal.      UC Treatments / Results  Labs (all labs ordered are listed, but only abnormal results are displayed) Labs Reviewed - No data to display  EKG   Radiology No results found.  Procedures Procedures (including critical care time)  Medications Ordered in UC Medications - No data to display  Initial Impression / Assessment and Plan / UC Course  I have reviewed the triage vital signs and the nursing notes.  Pertinent labs & imaging results that were available during my care of the patient were reviewed by me and considered in my medical decision making (see chart for details).    Given distribution of rash suspicious for shingles and will treat with Valtrex .  Discussed use of topical steroid as well to cover in the event symptoms could be related to contact dermatitis.  Encouraged follow-up if no gradual improvement or with any  worsening symptoms.  Final Clinical Impressions(s) / UC Diagnoses   Final diagnoses:  Herpes zoster without complication     Discharge Instructions       Ok to use topical steroid cream to hopefully help with itching and inflammation.   Follow up if no gradual improvement or with any worsening.      ED Prescriptions     Medication Sig Dispense Auth. Provider   valACYclovir  (VALTREX ) 1000 MG tablet Take 1 tablet (1,000 mg total) by mouth 3 (three) times daily. 21 tablet Vernestine Gondola, PA-C      PDMP not reviewed this encounter.   Vernestine Gondola, PA-C 04/03/24 [phone]

## 2024-04-03 NOTE — Discharge Instructions (Signed)
  Ok to use topical steroid cream to hopefully help with itching and inflammation.   Follow up if no gradual improvement or with any worsening.

## 2024-04-03 NOTE — ED Triage Notes (Signed)
"  I think I may have shingles v/s poison ivy". "I was in the hospital a few wks ago for a blocked colon (FYI). I have had Varicella in the past and a shingles vaccine (booster) last year. "The rash in lower right side of my back/flank area and a spot of two on my left clavicle".

## 2024-06-01 ENCOUNTER — Other Ambulatory Visit: Payer: Self-pay | Admitting: Internal Medicine

## 2024-06-01 DIAGNOSIS — K5904 Chronic idiopathic constipation: Secondary | ICD-10-CM

## 2024-06-02 ENCOUNTER — Other Ambulatory Visit: Payer: Self-pay | Admitting: Medical Genetics

## 2024-07-25 ENCOUNTER — Encounter: Payer: Self-pay | Admitting: Internal Medicine

## 2024-07-26 ENCOUNTER — Other Ambulatory Visit: Payer: Self-pay

## 2024-07-26 DIAGNOSIS — K5904 Chronic idiopathic constipation: Secondary | ICD-10-CM

## 2024-07-26 MED ORDER — LUBIPROSTONE 24 MCG PO CAPS: 24.0000 ug | ORAL_CAPSULE | Freq: Two times a day (BID) | ORAL | 2 refills | Status: AC

## 2024-09-04 ENCOUNTER — Ambulatory Visit: Admitting: Internal Medicine

## 2024-09-04 ENCOUNTER — Ambulatory Visit: Payer: Self-pay | Admitting: Internal Medicine

## 2024-09-04 ENCOUNTER — Encounter: Payer: Self-pay | Admitting: Internal Medicine

## 2024-09-04 ENCOUNTER — Other Ambulatory Visit (HOSPITAL_COMMUNITY)
Admission: RE | Admit: 2024-09-04 | Discharge: 2024-09-04 | Disposition: A | Discharge status: Home / Self Care | Payer: Self-pay | Source: Ambulatory Visit | Attending: Medical Genetics | Admitting: Medical Genetics

## 2024-09-04 VITALS — BP 138/82 | HR 59 | Temp 98.0°F | Resp 16 | Ht 60.0 in | Wt 106.4 lb

## 2024-09-04 DIAGNOSIS — I10 Essential (primary) hypertension: Secondary | ICD-10-CM

## 2024-09-04 DIAGNOSIS — K5904 Chronic idiopathic constipation: Secondary | ICD-10-CM

## 2024-09-04 DIAGNOSIS — Z23 Encounter for immunization: Secondary | ICD-10-CM | POA: Diagnosis not present

## 2024-09-04 DIAGNOSIS — Z1211 Encounter for screening for malignant neoplasm of colon: Secondary | ICD-10-CM

## 2024-09-04 DIAGNOSIS — Z0000 Encounter for general adult medical examination without abnormal findings: Secondary | ICD-10-CM | POA: Diagnosis not present

## 2024-09-04 DIAGNOSIS — Z13220 Encounter for screening for lipoid disorders: Secondary | ICD-10-CM | POA: Diagnosis not present

## 2024-09-04 DIAGNOSIS — Z0001 Encounter for general adult medical examination with abnormal findings: Secondary | ICD-10-CM

## 2024-09-04 LAB — BASIC METABOLIC PANEL WITH GFR
BUN: 9 mg/dL (ref 6–23)
CO2: 29 meq/L (ref 19–32)
Calcium: 9.4 mg/dL (ref 8.4–10.5)
Chloride: 105 meq/L (ref 96–112)
Creatinine, Ser: 0.7 mg/dL (ref 0.40–1.20)
GFR: 96.29 mL/min (ref >60.00)
Glucose, Bld: 74 mg/dL (ref 70–99)
Potassium: 4.2 meq/L (ref 3.5–5.1)
Sodium: 141 meq/L (ref 135–145)

## 2024-09-04 LAB — CBC WITH DIFFERENTIAL/PLATELET
Basophils Absolute: 0.1 K/uL (ref 0.0–0.1)
Basophils Relative: 1 % (ref 0.0–3.0)
Eosinophils Absolute: 0.2 K/uL (ref 0.0–0.7)
Eosinophils Relative: 4.4 % (ref 0.0–5.0)
HCT: 39.2 % (ref 36.0–46.0)
Hemoglobin: 13 g/dL (ref 12.0–15.0)
Lymphocytes Relative: 45.4 % (ref 12.0–46.0)
Lymphs Abs: 2.6 K/uL (ref 0.7–4.0)
MCHC: 33.1 g/dL (ref 30.0–36.0)
MCV: 92.7 fl (ref 78.0–100.0)
Monocytes Absolute: 0.4 K/uL (ref 0.1–1.0)
Monocytes Relative: 7.9 % (ref 3.0–12.0)
Neutro Abs: 2.3 K/uL (ref 1.4–7.7)
Neutrophils Relative %: 41.3 % — ABNORMAL LOW (ref 43.0–77.0)
Platelets: 228 K/uL (ref 150.0–400.0)
RBC: 4.24 Mil/uL (ref 3.87–5.11)
RDW: 13.2 % (ref 11.5–15.5)
WBC: 5.6 K/uL (ref 4.0–10.5)

## 2024-09-04 LAB — URINALYSIS, ROUTINE W REFLEX MICROSCOPIC
Bilirubin Urine: NEGATIVE
Hgb urine dipstick: NEGATIVE
Ketones, ur: NEGATIVE
Leukocytes,Ua: NEGATIVE
Nitrite: NEGATIVE
RBC / HPF: NONE SEEN (ref 0–?)
Specific Gravity, Urine: <=1.005 — AB (ref 1.000–1.030)
Total Protein, Urine: NEGATIVE
Urine Glucose: NEGATIVE
Urobilinogen, UA: 0.2 (ref 0.0–1.0)
WBC, UA: NONE SEEN (ref 0–?)
pH: 6 (ref 5.0–8.0)

## 2024-09-04 LAB — MAGNESIUM: Magnesium: 1.8 mg/dL (ref 1.5–2.5)

## 2024-09-04 LAB — LIPID PANEL
Cholesterol: 194 mg/dL (ref 0–200)
HDL: 72 mg/dL (ref >39.00)
LDL Cholesterol: 111 mg/dL — ABNORMAL HIGH (ref 0–99)
NonHDL: 122.06
Total CHOL/HDL Ratio: 3
Triglycerides: 55 mg/dL (ref 0.0–149.0)
VLDL: 11 mg/dL (ref 0.0–40.0)

## 2024-09-04 LAB — TSH: TSH: 2.71 u[IU]/mL (ref 0.35–5.50)

## 2024-09-04 MED ORDER — OLMESARTAN MEDOXOMIL 20 MG PO TABS: 20.0000 mg | ORAL_TABLET | Freq: Every day | ORAL | 1 refills | Status: AC

## 2024-09-04 MED ORDER — COVID-19 MRNA VAC-TRIS(PFIZER) 30 MCG/0.3ML IM SUSY: 0.3000 mL | PREFILLED_SYRINGE | Freq: Once | INTRAMUSCULAR | 0 refills | Status: AC

## 2024-09-04 NOTE — Progress Notes (Signed)
 Subjective:  Patient ID: Cheryl Hanna, female    DOB: 1966-09-29  Age: 58 y.o. MRN: [id number]  CC: Annual Exam and Hypertension   HPI Cheryl Hanna presents for a CPX and f/up ----  Discussed the use of AI scribe software for clinical note transcription with the patient, who gave verbal consent to proceed.  History of Present Illness Cheryl Hanna is a 58 year old female who presents for follow-up regarding her blood pressure management and fatigue.  Her blood pressure was slightly elevated today, and she is currently out of her blood pressure medication. She experiences fatigue and lethargy, particularly when taking her medication, and sometimes feels like she can barely catch her breath. She is unsure if these symptoms are directly related to the medication.  She has a history of bowel obstructions with two hospitalizations. The episodes occur unpredictably and cause significant pain. Currently, she is not experiencing constipation, which she attributes to her medication.  She maintains an active lifestyle, having recently completed a half marathon and regularly running 10 to 12 miles daily without experiencing chest pain or shortness of breath. She reports no issues with sleep and her weight has remained stable since last year.  She has received a flu shot recently and had a COVID vaccine last year. 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       Outpatient Medications Prior to Visit  Medication Sig Dispense Refill   lubiprostone  (AMITIZA ) 24 MCG capsule Take 1 capsule (24 mcg total) by mouth 2 (two) times daily with a meal. TAKE ONE CAPSULE BY MOUTH TWICE DAILY WITH A MEAL 180 capsule 2   indapamide  (LOZOL ) 1.25 MG tablet Take 1 tablet (1.25 mg total) by mouth daily. 90 tablet 1   olmesartan  (BENICAR ) 20 MG tablet TAKE 1  TABLET(20 MG) BY MOUTH DAILY (Patient not taking: Reported on 09/04/2024) 90 tablet 1   valACYclovir  (VALTREX ) 1000 MG tablet Take 1 tablet (1,000 mg total) by mouth 3 (three) times daily. 21 tablet 0   No facility-administered medications prior to visit.    ROS Review of Systems  Constitutional:  Positive for fatigue. Negative for appetite change, chills, diaphoresis, fever and unexpected weight change.  HENT: Negative.    Eyes: Negative.   Respiratory:  Negative for cough, chest tightness, shortness of breath and wheezing.   Cardiovascular:  Negative for chest pain, palpitations and leg swelling.  Gastrointestinal:  Positive for constipation. Negative for abdominal pain, blood in stool, diarrhea, nausea and vomiting.  Endocrine: Negative.   Genitourinary: Negative.  Negative for difficulty urinating and dysuria.  Musculoskeletal: Negative.  Negative for myalgias.  Skin: Negative.   Neurological:  Negative for dizziness, weakness, light-headedness and numbness.  Hematological:  Negative for adenopathy. Does not bruise/bleed easily.  Psychiatric/Behavioral: Negative.      Objective:  BP 138/82 (BP Location: Left Arm, Patient Position: Sitting, Cuff Size: Small)   Pulse (!) 59   Temp 98 F (36.7 C) (Oral)   Resp 16   Ht 5' (1.524 m)   Wt 106 lb 6.4 oz (48.3 kg)   SpO2 96%   BMI 20.78 kg/m   BP Readings from Last 3 Encounters:  09/04/24 138/82  04/03/24 131/87  03/09/24 (!) 136/98    Wt Readings from Last 3 Encounters:  09/04/24 106 lb 6.4 oz (48.3  kg)  04/03/24 102 lb 15.3 oz (46.7 kg)  08/09/23 103 lb (46.7 kg)    Physical Exam Vitals reviewed.  Constitutional:      Appearance: Normal appearance.  HENT:     Nose: Nose normal.     Mouth/Throat:     Mouth: Mucous membranes are moist.  Eyes:     General: No scleral icterus.    Conjunctiva/sclera: Conjunctivae normal.  Cardiovascular:     Rate and Rhythm: Regular rhythm. Bradycardia present.     Pulses: Normal  pulses.     Heart sounds: No murmur heard.    No friction rub. No gallop.  Pulmonary:     Effort: Pulmonary effort is normal.     Breath sounds: No stridor. No wheezing, rhonchi or rales.  Abdominal:     General: Abdomen is scaphoid. Bowel sounds are normal. There is no distension.     Palpations: Abdomen is soft. There is no hepatomegaly, splenomegaly or mass.     Tenderness: There is no abdominal tenderness. There is no guarding or rebound.  Musculoskeletal:        General: Normal range of motion.     Cervical back: Neck supple.     Right lower leg: No edema.     Left lower leg: No edema.  Lymphadenopathy:     Cervical: No cervical adenopathy.  Skin:    General: Skin is warm and dry.  Neurological:     General: No focal deficit present.     Mental Status: She is alert.  Psychiatric:        Mood and Affect: Mood normal.        Behavior: Behavior normal.     Lab Results  Component Value Date   WBC 5.6 09/04/2024   HGB 13.0 09/04/2024   HCT 39.2 09/04/2024   PLT 228.0 09/04/2024   GLUCOSE 74 09/04/2024   CHOL 194 09/04/2024   TRIG 55.0 09/04/2024   HDL 72.00 09/04/2024   LDLCALC 111 (H) 09/04/2024   ALT 41 03/08/2024   AST 29 03/08/2024   NA 141 09/04/2024   K 4.2 09/04/2024   CL 105 09/04/2024   CREATININE 0.70 09/04/2024   BUN 9 09/04/2024   CO2 29 09/04/2024   TSH 2.71 09/04/2024   HGBA1C 5.1 11/20/2020    No results found.  Assessment & Plan:  Need for immunization against influenza -     Flu vaccine trivalent PF, 6mos and older(Flulaval,Afluria,Fluarix,Fluzone)  Primary hypertension- BP is at goal. She does not feel well on indapamide . EKG is negative for LVH. Will restart the ARB. -     EKG 12-Lead -     Basic metabolic panel with GFR; Future -     CBC with Differential/Platelet; Future -     TSH; Future -     Magnesium; Future -     Urinalysis, Routine w reflex microscopic; Future -     Olmesartan  Medoxomil; Take 1 tablet (20 mg total) by mouth  daily.  Dispense: 90 tablet; Refill: 1  Chronic idiopathic constipation -     Basic metabolic panel with GFR; Future -     TSH; Future -     Magnesium; Future  Encounter for general adult medical examination with abnormal findings- Exam completed, labs reviewed (statin is not indicated), vaccines reviewed and updated, cancer screenings addressed, pt ed material was given.  -     Lipid panel; Future -     COVID-19 mRNA Vac-TriS(Pfizer); Inject 0.3 mLs into the  muscle once for 1 dose.  Dispense: 0.3 mL; Refill: 0  Colon cancer screening -     Cologuard     Follow-up: Return in about 6 months (around 03/05/2025).  Debby Molt, MD

## 2024-09-04 NOTE — Patient Instructions (Signed)

## 2024-09-12 LAB — GENECONNECT MOLECULAR SCREEN: Genetic Analysis Overall Interpretation: NEGATIVE

## 2024-09-14 LAB — COLOGUARD

## 2024-09-25 LAB — COLOGUARD: COLOGUARD: NEGATIVE

## 2024-10-17 ENCOUNTER — Ambulatory Visit: Admitting: Sports Medicine
